# Patient Record
Sex: Female | Born: 1960 | Hispanic: No | Marital: Married | Smoking: Never smoker
Health system: Southern US, Community
[De-identification: ages and names within clinical notes are randomized; demographics above are authoritative.]

## PROBLEM LIST (undated history)

## (undated) DIAGNOSIS — G43909 Migraine, unspecified, not intractable, without status migrainosus: Secondary | ICD-10-CM

## (undated) DIAGNOSIS — IMO0001 Reserved for inherently not codable concepts without codable children: Secondary | ICD-10-CM

## (undated) DIAGNOSIS — D649 Anemia, unspecified: Secondary | ICD-10-CM

## (undated) DIAGNOSIS — R011 Cardiac murmur, unspecified: Secondary | ICD-10-CM

## (undated) DIAGNOSIS — Z5189 Encounter for other specified aftercare: Secondary | ICD-10-CM

## (undated) DIAGNOSIS — E785 Hyperlipidemia, unspecified: Secondary | ICD-10-CM

## (undated) HISTORY — DX: Migraine, unspecified, not intractable, without status migrainosus: G43.909

## (undated) HISTORY — DX: Hyperlipidemia, unspecified: E78.5

## (undated) HISTORY — DX: Anemia, unspecified: D64.9

## (undated) HISTORY — PX: COLONOSCOPY W/ BIOPSIES: SHX1374

## (undated) HISTORY — PX: DILATION AND CURETTAGE OF UTERUS: SHX78

## (undated) HISTORY — PX: KNEE SURGERY: SHX244

## (undated) HISTORY — PX: LASER ABLATION OF THE CERVIX: SHX1949

## (undated) HISTORY — PX: ANKLE SURGERY: SHX546

## (undated) HISTORY — DX: Encounter for other specified aftercare: Z51.89

## (undated) HISTORY — PX: BUNIONECTOMY: SHX129

## (undated) HISTORY — DX: Reserved for inherently not codable concepts without codable children: IMO0001

## (undated) HISTORY — DX: Cardiac murmur, unspecified: R01.1

---
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
## 1999-01-31 HISTORY — PX: OTHER SURGICAL HISTORY: SHX169

## 2003-09-15 ENCOUNTER — Other Ambulatory Visit: Admission: RE | Admit: 2003-09-15 | Discharge: 2003-09-15 | Payer: Self-pay | Admitting: Obstetrics and Gynecology

## 2004-02-29 ENCOUNTER — Other Ambulatory Visit: Admission: RE | Admit: 2004-02-29 | Discharge: 2004-02-29 | Payer: Self-pay | Admitting: Obstetrics and Gynecology

## 2005-03-07 ENCOUNTER — Other Ambulatory Visit: Admission: RE | Admit: 2005-03-07 | Discharge: 2005-03-07 | Payer: Self-pay | Admitting: Obstetrics and Gynecology

## 2005-04-07 ENCOUNTER — Ambulatory Visit (HOSPITAL_COMMUNITY): Admission: RE | Admit: 2005-04-07 | Discharge: 2005-04-07 | Payer: Self-pay | Admitting: Obstetrics and Gynecology

## 2005-04-07 ENCOUNTER — Encounter (INDEPENDENT_AMBULATORY_CARE_PROVIDER_SITE_OTHER): Payer: Self-pay | Admitting: Specialist

## 2007-05-14 ENCOUNTER — Emergency Department (HOSPITAL_COMMUNITY): Admission: EM | Admit: 2007-05-14 | Discharge: 2007-05-14 | Payer: Self-pay | Admitting: Family Medicine

## 2010-06-17 NOTE — Op Note (Signed)
NAMEREBEL, LAUGHRIDGE             ACCOUNT NO.:  [id number]   MEDICAL RECORD NO.:  [id number]          PATIENT TYPE:  AMB   LOCATION:  SDC                           FACILITY:  [facility]   PHYSICIAN:  Duke Salvia. Marcelle Overlie, M.D.DATE OF BIRTH:  [birth year]   DATE OF PROCEDURE:  04/07/2005  DATE OF DISCHARGE:                                 OPERATIVE REPORT   PREOPERATIVE DIAGNOSIS:  Abnormal uterine bleeding, endometrial polyp.   POSTOPERATIVE DIAGNOSIS:  Abnormal uterine bleeding, endometrial polyp.   PROCEDURE:  D&C hysteroscopy, NovaSure EMA.   SURGEON:  Dr. Marcelle Overlie   ANESTHESIA:  Paracervical block plus MAC.   SPECIMENS REMOVED:  Endometrial curettings/endometrial polyp.   PROCEDURE AND FINDINGS:  The patient taken to the operating room.  After an  adequate level of sedation was obtained with the patient's legs in stirrups,  the perineum and vagina were prepped and draped in the usual manner for  vaginal procedures.  The bladder was drained, EUA carried out, uterus mid  position, normal size, adnexa negative.  She had received Cytotec 200 mcg  per vagina the night before surgery.  The cervix was grasped with a  tenaculum, paracervical block created by infiltrating at 3 and 9 o'clock  submucosally, 5-7 mL of 1% Xylocaine on either side after negative  aspiration.  The uterus sounded only to 7 cm.  It was progressively dilated  to a 27 Pratt.  Continued slow diagnostic hysteroscopy was then carried out  revealing a polyp at the fundus.  This was removed in 1 section with a polyp  forcep.  Sharp curettage was carried out, minimal tissue sent to pathology  along with the polyp fragment.  The scope was reinserted.  The cavity was  irrigated and noted to have a clean, smooth appearance.  The cervical length  was 2.5.  The NovaSure was then positioned per protocol, passing the CO2  test with a treatment cycle of 1 minute 20 seconds at a power setting of 62.  She tolerated this well.  There  were no immediate complications.  She went  to recovery room in good condition.  She did receive preop IV antibiotics  and intraoperative Toradol for postop pain relief.      Richard M. Marcelle Overlie, M.D.  Electronically Signed     RMH/MEDQ  D:  04/07/2005  T:  04/08/2005  Job:  [id number]

## 2010-06-17 NOTE — H&P (Signed)
Donna Grant, SULIMAN             ACCOUNT NO.:  [id number]   MEDICAL RECORD NO.:  [id number]          PATIENT TYPE:  AMB   LOCATION:  SDC                           FACILITY:  [facility]   PHYSICIAN:  Duke Salvia. Marcelle Overlie, M.D.DATE OF BIRTH:  10/18/1960   DATE OF ADMISSION:  04/07/2005  DATE OF DISCHARGE:                                HISTORY & PHYSICAL   CHIEF COMPLAINT:  Menorrhagia.   HISTORY OF PRESENT ILLNESS:  50 year old G0, P0.  Patient presents with a  several year history of menorrhagia.  She moved here from [place] where  she had had several D&Cs over the years, all four with benign tissue.  Her  records from [place] stated that she had a history of endometriosis,  although I could not find that she had had a laparoscopy in the past.  Records reviewed from 2003 did show that she was having problems with heavy  bleeding and dysfunctional bleeding at that time, although I could not find  any ultrasound reports included in those records from [place].  She did  have LEEP in 2004.  Paps since that time have been normal.  Recently, she  had failed control with hormones and due to continued problems with bleeding  we had recommended sonohysterography.  Because of poor insurance coverage  for that procedure she preferred diagnostic hysteroscopy, D&C with NovaSure  endometrial ablation.  This procedure including risks of bleeding,  infection, adjacent organ injury, other complications that may require open  or additional surgery all reviewed with her which she understands and  accepts.  The amenorrhea rate, hypomenorrhea rate, and expected outcome post  endometrial ablation reviewed with her which she understands and accepts.  She did review Cytotec 200 mcg per vagina the night before surgery.   PAST MEDICAL HISTORY:   ALLERGIES:  None.   CURRENT MEDICATIONS:  Crestor.   Her partner has had a vasectomy.   FAMILY HISTORY:  Significant for mother with lung and brain cancer.  Otherwise, unremarkable.   PHYSICAL EXAMINATION:  VITAL SIGNS:  Temperature 98.2, blood pressure  120/72.  HEENT:  Unremarkable.  NECK:  Supple without masses.  LUNGS:  Clear.  CARDIOVASCULAR:  Regular rate and rhythm without murmurs, rubs, or gallops  noted.  BREASTS:  Without masses.  ABDOMEN:  Soft, flat, nontender.  PELVIC:  Normal external genitalia,vagina, cervix clear.  Uterus mid  positional size.  Adnexa negative.   IMPRESSION:  Menorrhagia.  History of the same.   PLAN:  D&C, hysteroscopy with NovaSure endometrial ablation.  Procedure and  risks reviewed as above.      Richard M. Marcelle Overlie, M.D.  Electronically Signed     RMH/MEDQ  D:  04/04/2005  T:  04/04/2005  Job:  [id number]

## 2010-09-28 ENCOUNTER — Encounter (INDEPENDENT_AMBULATORY_CARE_PROVIDER_SITE_OTHER): Payer: Self-pay | Admitting: General Surgery

## 2010-10-04 ENCOUNTER — Encounter (INDEPENDENT_AMBULATORY_CARE_PROVIDER_SITE_OTHER): Payer: Self-pay | Admitting: General Surgery

## 2010-10-04 ENCOUNTER — Ambulatory Visit (INDEPENDENT_AMBULATORY_CARE_PROVIDER_SITE_OTHER): Payer: Commercial Indemnity | Admitting: General Surgery

## 2010-10-04 VITALS — BP 102/78 | HR 58 | Temp 98.0°F | Ht 65.5 in | Wt 161.5 lb

## 2010-10-04 DIAGNOSIS — D374 Neoplasm of uncertain behavior of colon: Secondary | ICD-10-CM

## 2010-10-04 DIAGNOSIS — D126 Benign neoplasm of colon, unspecified: Secondary | ICD-10-CM

## 2010-10-04 NOTE — Patient Instructions (Signed)
You have a villous adenoma of the right colon. You will be scheduled for a laparoscopic assisted right colectomy. Our nursing  staff will discuss the preoperative bowel prep and scheduling process.

## 2010-10-04 NOTE — Progress Notes (Signed)
Chief Complaint  Patient presents with  . Other    eval of colon polyps of cecum    HPI Donna Grant is a 49 y.o. female.    This patient is very healthy. She was referred to me by Dr. Carman Ching for surgical management of a large villous adenoma of the cecum. Her primary care physician is Dr. Halford Chessman  in [place]. Her gynecologist is Dr. Richarda Overlie.  The patient does not have any gastrointestinal symptoms. Denies nausea, vomiting, abdominal pain, change in bowel habits or blood in the stool. She denies weight change. She went for a routine exam with Dr. Richarda Overlie and he noted that she had never had a colonoscopy and is now 50 years old and referred her to Dr. Randa Evens.  Screening colonoscopy was performed on September 27, 2010. Dr. Randa Evens got all the way to the cecum and found a polyp that was carpeting the appendiceal orifice and was greater than 50 mm in size. There was a central depression but it was not ulcerated. Biopsies were taken and showed villous adenoma, but no dysplasia.  These are superficial biopsies in the deeper areas of the polyp have not been sampled.  She is otherwise healthy. She underwent endometrial ablation in 2000. She is postmenopausal. She had a left salpingo-oophorectomy through a Pfannenstiel incision in 2001. She's had right knee surgery and ankle fusion. Has hyperlipidemia.  Family history reveals no colon cancer or other cancers. She does not know anything about her father's side of the family. Her brother had a pancreatectomy for benign disease and is now insulin-dependent diabetic. This may have been pancreatitis.  She is here with her husband to discuss surgical management.  HPI  Past Medical History  Diagnosis Date  . Anemia   . Hyperlipidemia   . Migraine headache   . Blood transfusion   . Heart murmur     Past Surgical History  Procedure Date  . Dilation and curettage of uterus   . Laser ablation of the cervix   .  Ankle surgery   . Bunionectomy   . Knee surgery   . Colonoscopy w/ biopsies   . Ovarian cyst 2001  . Mouth surgery 1970    Family History  Problem Relation Age of Onset  . Cancer Mother 28    brain and lung     Social History History  Substance Use Topics  . Smoking status: Never Smoker   . Smokeless tobacco: Not on file  . Alcohol Use: Yes    No Known Allergies  Current Outpatient Prescriptions  Medication Sig Dispense Refill  . fish oil-omega-3 fatty acids 1000 MG capsule Take 1 g by mouth daily.        Marland Kitchen glucosamine-chondroitin 500-400 MG tablet Take 1 tablet by mouth 1 day or 1 dose.        . Multiple Vitamins-Minerals (MULTIVITAMIN WITH MINERALS) tablet Take 1 tablet by mouth daily.        . rosuvastatin (CRESTOR) 20 MG tablet Take 20 mg by mouth daily.        . vitamin D, CHOLECALCIFEROL, 400 UNITS tablet Take 400 Units by mouth daily.          Review of Systems Review of Systems  Constitutional: Negative.   HENT: Negative.   Eyes: Negative.   Respiratory: Negative.   Cardiovascular: Negative.   Gastrointestinal: Negative.   Genitourinary: Negative.   Musculoskeletal: Negative.   Neurological: Negative.   Hematological: Negative.   Psychiatric/Behavioral: Negative.  Blood pressure 102/78, pulse 58, temperature 98 F (36.7 C), height 5' 5.5" (1.664 m), weight 161 lb 8 oz (73.256 kg).  Physical Exam Physical Exam  Constitutional: She is oriented to person, place, and time. She appears well-developed and well-nourished. No distress.  HENT:  Head: Normocephalic and atraumatic.  Mouth/Throat: No oropharyngeal exudate.  Eyes: Conjunctivae and EOM are normal. Left eye exhibits no discharge. No scleral icterus.  Neck: Normal range of motion. Neck supple. No JVD present. No tracheal deviation present. No thyromegaly present.  Cardiovascular: Normal rate, regular rhythm, normal heart sounds and intact distal pulses.   No murmur heard. Pulmonary/Chest:  Breath sounds normal. No respiratory distress. She has no wheezes. She has no rales. She exhibits no tenderness.  Abdominal: Soft. Bowel sounds are normal. She exhibits no distension and no mass. There is no tenderness. There is no rebound and no guarding.    Musculoskeletal: Normal range of motion. She exhibits no edema and no tenderness.  Neurological: She is alert and oriented to person, place, and time. She exhibits normal muscle tone.  Skin: Skin is warm and dry. No rash noted. She is not diaphoretic. No erythema. No pallor.  Psychiatric: She has a normal mood and affect. Her behavior is normal. Judgment and thought content normal.    Data Reviewed I reviewed Dr. Randa Evens colonoscopy report. I reviewed the pathology report and gave the patient a copy of this. I reviewed Dr. Randa Evens office notes. I had a discussion with Dr. Randa Evens.  Assessment    Large villous adenoma of the cecum, located at the appendiceal orifice. There is no dysplasia seen on superficial biopsy. It is possible that there could be cancer deeper within the polyp.  Hyperlipidemia.  Status post open left salpingo-oophorectomy.    Plan    I had a long discussion with the patient and her husband. She is aware of the location of the polyp, and the possibility that this could contain cancer within the deeper layers of the specimen. She is aware that the next after treatment for surgical resection of this area with a laparoscopic-assisted right colectomy.  I have discussed the indications and details of laparoscopic-assisted right colectomy with her and her husband. Risks and complications have been outlined, including but not limited to bleeding, infection, conversion to open laparotomy, injury to adjacent organs such as the intestine or ureter or bladder or vascular structures with major reconstructive surgery, anastomotic leak necessitating reoperation and colostomy, cardiac pulmonary and thromboembolic problems. She is  aware that this may be cancer. She seems to understand all these issues well. At this time all of her questions were answered. She is in full agreement with this plan.  She would like to go ahead with scheduling of this surgery in mid September after her vacation next week.       Williams Dietrick M 10/04/2010, 5:32 PM

## 2010-11-02 ENCOUNTER — Other Ambulatory Visit (INDEPENDENT_AMBULATORY_CARE_PROVIDER_SITE_OTHER): Payer: Self-pay | Admitting: General Surgery

## 2010-11-02 ENCOUNTER — Encounter (HOSPITAL_COMMUNITY): Payer: Commercial Indemnity

## 2010-11-02 ENCOUNTER — Ambulatory Visit (HOSPITAL_COMMUNITY)
Admission: RE | Admit: 2010-11-02 | Discharge: 2010-11-02 | Disposition: A | Discharge status: Home / Self Care | Payer: Commercial Indemnity | Source: Ambulatory Visit | Attending: General Surgery | Admitting: General Surgery

## 2010-11-02 DIAGNOSIS — Z01818 Encounter for other preprocedural examination: Secondary | ICD-10-CM

## 2010-11-02 DIAGNOSIS — Z01812 Encounter for preprocedural laboratory examination: Secondary | ICD-10-CM | POA: Insufficient documentation

## 2010-11-02 LAB — URINALYSIS, ROUTINE W REFLEX MICROSCOPIC
Glucose, UA: NEGATIVE mg/dL
Ketones, ur: NEGATIVE mg/dL
Leukocytes, UA: NEGATIVE
Protein, ur: NEGATIVE mg/dL

## 2010-11-02 LAB — COMPREHENSIVE METABOLIC PANEL
AST: 25 U/L (ref 0–37)
BUN: 10 mg/dL (ref 6–23)
CO2: 30 mEq/L (ref 19–32)
Calcium: 9.4 mg/dL (ref 8.4–10.5)
Chloride: 102 mEq/L (ref 96–112)
Creatinine, Ser: 0.59 mg/dL (ref 0.50–1.10)
GFR calc Af Amer: >90 mL/min (ref >90)
GFR calc non Af Amer: >90 mL/min (ref >90)
Total Bilirubin: 0.4 mg/dL (ref 0.3–1.2)

## 2010-11-02 LAB — CBC
MCH: 30.6 pg (ref 26.0–34.0)
MCHC: 33.7 g/dL (ref 30.0–36.0)
Platelets: 229 10*3/uL (ref 150–400)

## 2010-11-02 LAB — DIFFERENTIAL
Basophils Relative: 1 % (ref 0–1)
Eosinophils Absolute: 0.1 10*3/uL (ref 0.0–0.7)
Monocytes Absolute: 0.3 10*3/uL (ref 0.1–1.0)
Monocytes Relative: 7 % (ref 3–12)

## 2010-11-02 LAB — SURGICAL PCR SCREEN
MRSA, PCR: NEGATIVE
Staphylococcus aureus: NEGATIVE

## 2010-11-02 LAB — ABO/RH: ABO/RH(D): B POS

## 2010-11-02 MED ORDER — METRONIDAZOLE 500 MG PO TABS: 500.0000 mg | ORAL_TABLET | Freq: Three times a day (TID) | ORAL | Status: AC

## 2010-11-02 MED ORDER — CIPROFLOXACIN HCL 500 MG PO TABS: 500.0000 mg | ORAL_TABLET | Freq: Two times a day (BID) | ORAL | Status: AC

## 2010-11-08 ENCOUNTER — Other Ambulatory Visit (INDEPENDENT_AMBULATORY_CARE_PROVIDER_SITE_OTHER): Payer: Self-pay | Admitting: General Surgery

## 2010-11-08 ENCOUNTER — Inpatient Hospital Stay (HOSPITAL_COMMUNITY)
Admission: RE | Admit: 2010-11-08 | Discharge: 2010-11-12 | DRG: 331 | Disposition: A | Discharge status: Home / Self Care | Payer: Commercial Indemnity | Source: Ambulatory Visit | Attending: General Surgery | Admitting: General Surgery

## 2010-11-08 DIAGNOSIS — N805 Endometriosis of intestine, unspecified: Secondary | ICD-10-CM | POA: Diagnosis present

## 2010-11-08 DIAGNOSIS — D126 Benign neoplasm of colon, unspecified: Secondary | ICD-10-CM

## 2010-11-08 DIAGNOSIS — Z01818 Encounter for other preprocedural examination: Secondary | ICD-10-CM

## 2010-11-08 DIAGNOSIS — D378 Neoplasm of uncertain behavior of other specified digestive organs: Principal | ICD-10-CM | POA: Diagnosis present

## 2010-11-08 DIAGNOSIS — E785 Hyperlipidemia, unspecified: Secondary | ICD-10-CM | POA: Diagnosis present

## 2010-11-08 DIAGNOSIS — Z01812 Encounter for preprocedural laboratory examination: Secondary | ICD-10-CM

## 2010-11-08 DIAGNOSIS — D371 Neoplasm of uncertain behavior of stomach: Principal | ICD-10-CM | POA: Diagnosis present

## 2010-11-08 HISTORY — PX: COLON SURGERY: SHX602

## 2010-11-08 LAB — TYPE AND SCREEN: ABO/RH(D): B POS

## 2010-11-08 NOTE — Op Note (Signed)
NAME:  CALAIS, SVEHLA NO.:  [id number]  MEDICAL RECORD NO.:  [id number]  LOCATION:  0004                         FACILITY:  [facility]  PHYSICIAN:  Angelia Mould. Derrell Lolling, M.D.DATE OF BIRTH:  1960-11-03  DATE OF PROCEDURE:  11/08/2010 DATE OF DISCHARGE:                              OPERATIVE REPORT   PREOPERATIVE DIAGNOSIS:  Villous adenoma of cecum.  POSTOPERATIVE DIAGNOSES: 1. Villous adenoma of the cecum. 2. Fibrotic nodules of distal ileum.  OPERATIONS PERFORMED: 1. Laparoscopic-assisted right colectomy. 2. Laparoscopic assisted small bowel resection.  SURGEON:  Dr. Claud Kelp.  FIRST ASSISTANT:  Dr. Ovidio Kin.  OPERATIVE INDICATIONS:  This is a 50 year old Caucasian female who is in good health.  She had her first ever screening colonoscopy recently and Dr. Randa Evens found a large polyp that was carpeting the appendiceal orifice.  He stated that it was greater than 50 mm in size with a central depression.  Surface biopsy showed villous adenoma, no dysplasia.  The only prior surgery she has had is a left salpingo- oophorectomy in 2001 for benign process.  She was advised to undergo resection because of the neoplastic nature of the polyp.  She is aware that there is a possibility that there is early cancer deeper within the specimen.  She has undergone a bowel prep at home and is brought to the operating room electively.  OPERATIVE FINDINGS:  The patient had basically normal anatomy except there was 1 or 2 small adhesions in the pelvis from her previous surgery.  The appendix looked normal.  The colon looked normal.  The liver and gallbladder looked normal.  Once we removed the right colon specimen, we could feel about a 3 cm soft fleshy mass in the cecum, but there was no evidence of malignancy.  Also noted was a couple of firm whitish areas in the distal ileum proximal to the initial line of resection.  It was not clear whether this was just simply  fibrosis or burned out endometriosis, we therefore resected this.  There was no other evidence of endometriosis, however.  OPERATIVE TECHNIQUE:  Following the induction of general endotracheal anesthesia, a Foley catheter was placed.  Intravenous antibiotics were given.  The abdomen was prepped and draped in sterile fashion.  Surgical time-out was held identifying correct patient and correct procedure.  At the end of the case, we injected 30 cc Exparel, which was a 50% solution.  This was injected into all of the incisions.  Short vertical incision was made in the midline just above the umbilicus.  The fascia was incised in the midline and the abdominal cavity entered under direct vision.  An 11 mm Hasson trocar was inserted and secured with pursestring suture of 0 Vicryl.  Pneumoperitoneum was created.  Video visualization was carried out with findings as described above.  A 5 mm trocar was placed in the lower midline and a 5 mm trocar placed in the left mid abdomen.  After examining the abdomen in general, we clarified the anatomy of the appendix and the terminal ileum.  We divided the lateral peritoneal attachments of the cecum and terminal ileum and mobilized them up toward the left upper quadrant.  We continued the dissection cephalad dividing the lateral peritoneal attachments of the right colon all the way up to the hepatic flexure.  We slowly mobilized the colon and cecum medially. We then repositioned the patient and then took down the hepatic flexure. We use all this with the EnSeal energy device.  We identified the gastric antrum, the pylorus, and the C-loop of the duodenum.  The duodenum was well visualized throughout the case and preserved.  Once we had complete mobilization, we felt that we could proceed to exteriorize the specimen.  The pneumoperitoneum was released.  The [facility] catheter was removed. The midline incision was made approximately 7 cm in length starting  just above the umbilicus and going up the midline.  The fascia was incised in the midline.  A wound protector was placed.  The right colon and terminal ileum were easily delivered into the wound.  There was no bleeding.  We transected the terminal ileum about 2 inches proximal to the ileocecal valve using the GIA stapling device.  We transected the right transverse colon just to the right of the middle colic vessels using a GIA stapling device.  We then skeletonized all of the right colic and ileocolic vessels.  Larger vessels were clamped, divided, and ligated with 2-0 silk ties.  Smaller vessels were divided with the EnSeal.  The specimen was sent to the lab for routine histology with the attached history.  We then inspected the bowel and found that there were a couple of a whitish firm areas in the distal ileum about 3 inches proximal to the transection line.  We examined the rest of the small bowel and found no other areas like this.  We were unsure of the diagnosis.  We took the GIA and then resected about 6 more inches of distal ileum.  We divided the mesentery with the EnSeal device.  We sent that specimen to the lab. Dr. Dierdre Searles examined these with frozen section and said it was benign fibrosis.  She said it would be possible that this might be burned out endometriosis, but there was no evidence of malignancy.  We felt that no further surgery would be indicated.  We created an anastomosis between the distal ileum and the right transverse colon using a GIA stapling device.  The defect in the bowel wall was closed with a TA-60 stapling device.  Further sutures of 3-0 silk were placed to reinforce the staple line at critical points.  The anastomosis was looked at and inspected and looked good.  We then changed our instruments and gloves.  The mesentery was closed with multiple interrupted sutures of 3-0 silk.  The abdomen was irrigated with saline and the irrigation fluid removed as  best as possible.  The midline fascia was closed with a running suture of #1 double-stranded PDS.  The skin was irrigated with saline.  We re-insufflated the abdomen and put a 5 mm camera in the lower ports.  We looked around.  There was a little bit of irrigation fluid in the pelvis, right paracolic gutter, and subphrenic spaces; we removed that.  We could see the anastomosis.  It did not appear to be any blood.  We then released the pneumoperitoneum. We removed the trocars.  We irrigated the skin incisions with saline. The skin incisions were then closed with subcuticular sutures of Monocryl.  Clean bandages were placed and the patient taken to recovery room in stable condition.  Estimated blood loss was about 30 to 50 cc.  Complications, none.  Sponge, needle, and instrument counts were correct.     Angelia Mould. Derrell Lolling, M.D.     HMI/MEDQ  D:  11/08/2010  T:  11/08/2010  Job:  [id number]  cc:   Fayrene Fearing L. Malon Kindle., M.D. Fax: [phone]  Dr. Rosina Lowenstein. Marcelle Overlie, M.D. Fax: [phone]  Electronically Signed by Claud Kelp M.D. on 11/08/2010 02:34:41 PM

## 2010-11-15 ENCOUNTER — Encounter (INDEPENDENT_AMBULATORY_CARE_PROVIDER_SITE_OTHER): Payer: Self-pay | Admitting: General Surgery

## 2010-11-16 ENCOUNTER — Telehealth (INDEPENDENT_AMBULATORY_CARE_PROVIDER_SITE_OTHER): Payer: Self-pay | Admitting: General Surgery

## 2010-11-16 NOTE — Discharge Summary (Signed)
  NAME:  [name], Donna Grant NO.:  [id number]  MEDICAL RECORD NO.:  [id number]  LOCATION:  1540                         FACILITY:  [facility]  PHYSICIAN:  Angelia Mould. Derrell Lolling, M.D.DATE OF BIRTH:  October 15, 1960  DATE OF ADMISSION:  11/08/2010 DATE OF DISCHARGE:  11/12/2010                              DISCHARGE SUMMARY   FINAL DIAGNOSES: 1. Villous adenoma of the cecum. 2. Endometriosis of the terminal ileum. 3. Hyperlipidemia. 4. history of left salpingo-oophorectomy.  OPERATION PERFORMED: 1. Laparoscopic-assisted right colectomy. 2. Laparoscopic-assisted small bowel resection, date November 08, 2010.  HISTORY:  This is a 50 year old Caucasian female who had a screening colonoscopy recently and a large polyp was found carpeting the appendiceal orifice.  This was described as being 50 mm in size. Surface biopsy showed villous adenoma and no dysplasia.  Her only prior surgery was a left salpingo-oophorectomy and she stated that she had a cyst and possibly was told she had some endometriosis.  She was advised to undergo resection of the colon lesion and agreed to that and underwent a bowel prep at home and was brought to the hospital electively.  HOSPITAL COURSE:  On the day of admission, the patient underwent laparoscopic-assisted right colectomy.  We found a small fleshy polyp in the cecum and also found some whitish firm nodules on the distal ileum, which necessitated a second separate resection.  We were able to simply include all of this in the resection field and perform a single anastomosis.  Final pathology report showed a tubulovillous adenoma, 3.2 cm in dimension of the cecum.  There was no invasive cancer.  25 lymph nodes were evaluated and they are benign.  The small intestine resection showed endometriosis with associated adhesions.  There was no evidence of malignancy.  The patient was advised of her pathology report.  Postoperatively, she did well.  We  removed the Foley catheter and started a clear liquid diet on postop day 1.  She was maintained on Entereg protocol.  She became more active.  We were able to cut her narcotic back and advanced her diet over the next couple of days.  She did well.  She was ready for discharge on October 13th.  At that time, she was feeling better and tolerating her diet better.  She had loose bowel movements and wanted to go home.  Her wounds looked good.  She was given a prescription for Vicodin for pain.  She was told to continue her usual medicines, which are, 1. Crestor 20 mg a day. 2. Fish oil 500 mg twice a day. 3. Multivitamins once a day. 4. Vitamin D 1 tab daily. She was asked to return to see me in the office in 3 weeks.  Diet and activities were discussed.     Angelia Mould. Derrell Lolling, M.D.     HMI/MEDQ  D:  11/15/2010  T:  11/16/2010  Job:  [id number]  cc:   Fayrene Fearing L. Malon Kindle., M.D. Fax: [phone]  Duke Salvia. Marcelle Overlie, M.D. Fax: [phone]  Dr. Halford Chessman  Electronically Signed by Claud Kelp M.D. on 11/16/2010 01:43:20 PM

## 2010-11-16 NOTE — Telephone Encounter (Signed)
Patient has a po appt on 12/06/10, but is returning to work on 12/01/10.  She needs a return to work letter faxed to [phone].

## 2010-11-21 ENCOUNTER — Telehealth (INDEPENDENT_AMBULATORY_CARE_PROVIDER_SITE_OTHER): Payer: Self-pay

## 2010-11-21 NOTE — Telephone Encounter (Signed)
RTW note mailed to pts home address per pt request.

## 2010-12-06 ENCOUNTER — Ambulatory Visit (INDEPENDENT_AMBULATORY_CARE_PROVIDER_SITE_OTHER): Payer: Commercial Indemnity | Admitting: General Surgery

## 2010-12-06 ENCOUNTER — Encounter (INDEPENDENT_AMBULATORY_CARE_PROVIDER_SITE_OTHER): Payer: Self-pay | Admitting: General Surgery

## 2010-12-06 VITALS — BP 116/80 | HR 68 | Temp 97.4°F | Resp 14 | Ht 65.0 in | Wt 157.2 lb

## 2010-12-06 DIAGNOSIS — Z9889 Other specified postprocedural states: Secondary | ICD-10-CM

## 2010-12-06 NOTE — Progress Notes (Signed)
Subjective:     Patient ID: Donna Grant, female   DOB: 06-07-1960, 50 y.o.   MRN: [id number]  HPI She returns for a postop check. She is doing well.  The patient underwent a laparoscopic-assisted right colectomy and laparoscopic-assisted small bowel resection with a single anastomosis. Date of surgery was November 08, 2010. Final pathology showed a benign villous adenoma of the cecum and endometriosis implants on the terminal ileum. She has been informed of her diagnosis and given a copy of her pathology report.  She is basically feeling well. Her appetite is normal. Her bowel movements have returned to normal. She's had no fever chills or drainage from her incision. She has minimal tenderness in her incision.  Review of Systems     Objective:   Physical Exam The patient looks well. She is fit. She is in no distress  Abdomen soft and nontender. Midline incision and trocar sites are well healed. No signs of infection or hernia.   Assessment:     Villous adenoma of cecum.  Endometriosis implants of terminal ileum.  Uneventful recovery following laparoscopic-assisted right colectomy.    Plan:     Okay to resume all normal physical activities without restriction in 7-10 days.  She is advised to get a colonoscopy in one year and periodically thereafter.  Return to see me p.r.n.

## 2010-12-06 NOTE — Patient Instructions (Signed)
The large polyp in your colon was a benign villous adenoma. There was no evidence of malignancy. You also had some old endometriosis spots on the small intestine. All of this has been removed. Your are doing well from the surgery. You  may return to the gym for light exercise next week and full exercise the following week. I advised you to get a colonoscopy in one year and periodically thereafter. Return to see me if there are any further problems.

## 2011-10-11 ENCOUNTER — Encounter (INDEPENDENT_AMBULATORY_CARE_PROVIDER_SITE_OTHER): Payer: Self-pay

## 2014-02-10 ENCOUNTER — Ambulatory Visit
Admission: RE | Admit: 2014-02-10 | Discharge: 2014-02-10 | Disposition: A | Discharge status: Home / Self Care | Payer: Managed Care, Other (non HMO) | Source: Ambulatory Visit | Attending: Family Medicine | Admitting: Family Medicine

## 2014-02-10 ENCOUNTER — Other Ambulatory Visit: Payer: Self-pay | Admitting: Lactation Services

## 2014-02-10 DIAGNOSIS — R059 Cough, unspecified: Secondary | ICD-10-CM

## 2014-02-10 DIAGNOSIS — R05 Cough: Secondary | ICD-10-CM

## 2014-04-30 ENCOUNTER — Ambulatory Visit
Admission: RE | Admit: 2014-04-30 | Discharge: 2014-04-30 | Disposition: A | Discharge status: Home / Self Care | Payer: Managed Care, Other (non HMO) | Source: Ambulatory Visit | Attending: Family Medicine | Admitting: Family Medicine

## 2014-04-30 ENCOUNTER — Other Ambulatory Visit: Payer: Self-pay | Admitting: Family Medicine

## 2014-04-30 DIAGNOSIS — J189 Pneumonia, unspecified organism: Secondary | ICD-10-CM

## 2014-06-09 ENCOUNTER — Other Ambulatory Visit: Payer: Self-pay | Admitting: Podiatry

## 2014-10-15 ENCOUNTER — Other Ambulatory Visit: Payer: Self-pay | Admitting: General Surgery

## 2017-03-30 ENCOUNTER — Telehealth: Payer: Self-pay | Admitting: Podiatry

## 2017-03-30 NOTE — Telephone Encounter (Signed)
Pt called and said she seen [place] several years ago with a workman's comp case and they sent [place] a request for [place] to see her again for new orthotics.  She emailed me some information that the Building control surveyor stated she has sent to [place]. But it is missing some information.  I left message for claims manager to call me back.  I notified pt that we are needing additional information and I have lvm for claims manager and waiting on a call back.When I get all the information I will call pt to schedule an appt.

## 2017-12-05 ENCOUNTER — Other Ambulatory Visit: Payer: Self-pay | Admitting: Obstetrics and Gynecology

## 2017-12-05 DIAGNOSIS — Z803 Family history of malignant neoplasm of breast: Secondary | ICD-10-CM

## 2018-01-03 ENCOUNTER — Encounter: Payer: Self-pay | Admitting: Physician Assistant

## 2018-01-03 ENCOUNTER — Ambulatory Visit: Payer: Commercial Managed Care - PPO | Admitting: Physician Assistant

## 2018-01-03 VITALS — BP 138/68 | HR 65 | Ht 65.5 in | Wt 184.4 lb

## 2018-01-03 DIAGNOSIS — E7801 Familial hypercholesterolemia: Secondary | ICD-10-CM | POA: Diagnosis not present

## 2018-01-03 DIAGNOSIS — R079 Chest pain, unspecified: Secondary | ICD-10-CM

## 2018-01-03 MED ORDER — METOPROLOL TARTRATE 50 MG PO TABS: 50.0000 mg | ORAL_TABLET | Freq: Once | ORAL | 0 refills | Status: DC

## 2018-01-03 NOTE — Progress Notes (Signed)
Cardiology Office Note   Date:  01/03/2018   ID:  Donna Grant, DOB 12-Aug-1960, MRN [id number]  PCP:  Jonathon Jordan, MD Cardiologist:   New, will be Dr Oliver Barre, PA-C   No chief complaint on file.   History of Present Illness: Donna Grant is a 57 y.o. female with a history of familial hyperlipidemia, migraines, anemia, colon polyps  Her husband was seen in the office 2 days ago and she mentioned episodes of chest pain, appointment made  Donna Grant presents for cardiology evaluation.   She has had hyperlipidemia, dx age 45. However, she tried dietary management until age 57. She has been on Crestor since then. She is not always compliant with a low-cholesterol, but feels she generally does very well.   At one point, her cholesterol total was 344, even when she was very strict with the diet.   She has been getting chest pain for about a year. It is a dull pain, mid-left chest.   She rides a mountain bike, does strength training 2 x week. She does not have exertional symptoms.   The pain will start at rest, 2/10 at its worst, a dull pain. No SOB, N&V or diaphoresis. No change w/ deep inspiration, not positional. It will last just a few seconds. She gets 2-3 episodes a day. No rx tried.   The other day, when she was having the chest pain, she had sharp pain radiate to her L arm. That was unusual. It concerned her and led to her mentioning the pain and desiring an evaluation.   No factors that have made the pain worse or better.   She has been going through menopause for over a decade, is on the tail end of it now.    Past Medical History:  Diagnosis Date  . Anemia   . Blood transfusion   . Heart murmur   . Hyperlipidemia   . Migraine headache     Past Surgical History:  Procedure Laterality Date  . ANKLE SURGERY    . BUNIONECTOMY    . COLON SURGERY  11/08/10   Lap right colectomy  . COLONOSCOPY W/ BIOPSIES    . DILATION AND CURETTAGE OF UTERUS     . KNEE SURGERY    . LASER ABLATION OF THE CERVIX    . MOUTH SURGERY  1970  . ovarian cyst  2001    Current Outpatient Medications  Medication Sig Dispense Refill  . Calcium-Phosphorus-Vitamin D (CITRACAL +D3 PO) Take by mouth. 600 mg-500 IU    . fish oil-omega-3 fatty acids 1000 MG capsule Take 1 g by mouth daily.      . Multiple Vitamins-Minerals (MULTIVITAMIN WITH MINERALS) tablet Take 1 tablet by mouth daily.      . rosuvastatin (CRESTOR) 20 MG tablet Take 20 mg by mouth daily.      . metoprolol tartrate (LOPRESSOR) 50 MG tablet Take 1 tablet (50 mg total) by mouth once for 1 dose. 1 tablet 0   No current facility-administered medications for this visit.     Allergies:   Patient has no known allergies.    Social History:  The patient  reports that she has never smoked. She has never used smokeless tobacco. She reports that she drinks alcohol. She reports that she does not use drugs.   Family History:  The patient's family history includes Cancer (age of onset: 50) in her mother.  She indicated that her mother is deceased. She indicated that her  father is alive.    ROS:  Please see the history of present illness. All other systems are reviewed and negative.    PHYSICAL EXAM: VS:  BP 138/68   Pulse 65   Ht 5' 5.5" (1.664 m)   Wt 184 lb 6.4 oz (83.6 kg)   BMI 30.22 kg/m  , BMI Body mass index is 30.22 kg/m. GEN: Well nourished, well developed, female in no acute distress HEENT: normal for age  Neck: no JVD, no carotid bruit, no masses Cardiac: RRR; soft murmur, no rubs, or gallops Respiratory:  clear to auscultation bilaterally, normal work of breathing GI: soft, nontender, nondistended, + BS MS: no deformity or atrophy; no edema; distal pulses are 2+ in all 4 extremities  Skin: warm and dry, no rash Neuro:  Strength and sensation are intact Psych: euthymic mood, full affect   EKG:  EKG is ordered today. The ekg ordered today demonstrates sinus rhythm, heart  rate 65, normal intervals, no acute ischemic changes and no Q waves  No previous cardiac testing   Recent Labs: No results found for requested labs within last 8760 hours.  CBC    Component Value Date/Time   WBC 4.1 11/02/2010 0930   RBC 4.44 11/02/2010 0930   HGB 13.6 11/02/2010 0930   HCT 40.4 11/02/2010 0930   PLT 229 11/02/2010 0930   MCV 91.0 11/02/2010 0930   MCH 30.6 11/02/2010 0930   MCHC 33.7 11/02/2010 0930   RDW 12.9 11/02/2010 0930   LYMPHSABS 1.1 11/02/2010 0930   MONOABS 0.3 11/02/2010 0930   EOSABS 0.1 11/02/2010 0930   BASOSABS 0.0 11/02/2010 0930   CMP Latest Ref Rng & Units 11/02/2010  Glucose 70 - 99 mg/dL 84  BUN 6 - 23 mg/dL 10  Creatinine 0.50 - 1.10 mg/dL 0.59  Sodium 135 - 145 mEq/L 137  Potassium 3.5 - 5.1 mEq/L 4.3  Chloride 96 - 112 mEq/L 102  CO2 19 - 32 mEq/L 30  Calcium 8.4 - 10.5 mg/dL 9.4  Total Protein 6.0 - 8.3 g/dL 7.4  Total Bilirubin 0.3 - 1.2 mg/dL 0.4  Alkaline Phos 39 - 117 U/L 29(L)  AST 0 - 37 U/L 25  ALT 0 - 35 U/L 17     Lipid Panel Per [facility] review: 04/02/2017 Total cholesterol 204 HDL 60 LDL 133 Triglycerides 39    Wt Readings from Last 3 Encounters:  01/03/18 184 lb 6.4 oz (83.6 kg)  12/06/10 157 lb 3.2 oz (71.3 kg)  10/04/10 161 lb 8 oz (73.3 kg)     Other studies Reviewed: Additional studies/ records that were reviewed today include: Office notes, hospital records and testing.  ASSESSMENT AND PLAN: The patient and plan were reviewed with Dr. Oval Linsey  1.  Chest pain, moderate risk for cardiac etiology: -It will help to have an assessment of any plaque in her coronary arteries, regardless of whether it is obstructive -She is fit and exercises regularly without symptoms, do not believe any kind of treadmill testing will be helpful. - Therefore, the best test is a cardiac CT.  If she has nonobstructive coronary artery disease or a high calcium score, her goal LDL will decrease to 70. -If she has any plaque at  all, she should also be started on a baby aspirin, and a beta-blocker in addition to her statin  2.  Familial hyperlipidemia: Currently, goal LDL is less than 130 and she is there. - However, if she has any plaque at all in her arteries  or an elevated calcium score, goal LDL decreases to less than 70. -At that time, she would need to take a higher dose of Crestor   Current medicines are reviewed at length with the patient today.  The patient does not have concerns regarding medicines.  The following changes have been made:  no change  Labs/ tests ordered today include:   Orders Placed This Encounter  Procedures  . CT CORONARY MORPH W/CTA COR W/SCORE W/CA W/CM &/OR WO/CM  . CT CORONARY FRACTIONAL FLOW RESERVE DATA PREP  . CT CORONARY FRACTIONAL FLOW RESERVE FLUID ANALYSIS  . Basic metabolic panel  . EKG 12-Lead     Disposition:   FU with Skeet Latch, MD  Signed, Rosaria Ferries, PA-C  01/03/2018 4:06 PM    [place] Phone: [phone]; Fax: [phone]

## 2018-01-03 NOTE — Patient Instructions (Addendum)
Medication Instructions:  The current medical regimen is effective;  continue present plan and medications.  If you need a refill on your cardiac medications before your next appointment, please call your pharmacy.   Lab work: BMET If you have labs (blood work) drawn today and your tests are completely normal, you will receive your results only by: Marland Kitchen MyChart Message (if you have MyChart) OR . A paper copy in the mail If you have any lab test that is abnormal or we need to change your treatment, we will call you to review the results.  Testing/Procedures: Your physician has requested that you have cardiac CT (NEXT WEEK). Cardiac computed tomography (CT) is a painless test that uses an x-ray machine to take clear, detailed pictures of your heart. For further information please visit [email or web address][email or web address]. Please follow instruction sheet as given.   Follow-Up: At [facility], you and your health needs are our priority.  As part of our continuing mission to provide you with exceptional heart care, we have created designated Provider Care Teams.  These Care Teams include your primary Cardiologist (physician) and Advanced Practice Providers (APPs -  Physician Assistants and Nurse Practitioners) who all work together to provide you with the care you need, when you need it. You will need a follow up appointment after Cardiac CT with Dr.Chatham or one of the following Advanced Practice Providers on your designated Care Team:   . Rosaria Ferries, PA-C  Any Other Special Instructions Will Be Listed Below (If Applicable).    Please arrive at the [facility] main entrance of [facility] at xx:xx AM (30-45 minutes prior to test start time)  [facility] [place],  [id number] [phone]  Proceed to the [facility] Radiology Department (First Floor).  Please follow these instructions carefully (unless otherwise directed):  Hold all erectile dysfunction  medications at least 48 hours prior to test.  On the Night Before the Test: . Be sure to Drink plenty of water. . Do not consume any caffeinated/decaffeinated beverages or chocolate 12 hours prior to your test. . Do not take any antihistamines 12 hours prior to your test.  On the Day of the Test: . Drink plenty of water. Do not drink any water within one hour of the test. . Do not eat any food 4 hours prior to the test. . You may take your regular medications prior to the test.  . Take metoprolol (Lopressor) one hour prior to test. . HOLD Furosemide/Hydrochlorothiazide morning of the test.      After the Test: . Drink plenty of water. . After receiving IV contrast, you may experience a mild flushed feeling. This is normal. . On occasion, you may experience a mild rash up to 24 hours after the test. This is not dangerous. If this occurs, you can take Benadryl 25 mg and increase your fluid intake. . If you experience trouble breathing, this can be serious. If it is severe call 911 IMMEDIATELY. If it is mild, please call our office. . If you take any of these medications: Glipizide/Metformin, Avandament, Glucavance, please do not take 48 hours after completing test.

## 2018-02-04 ENCOUNTER — Telehealth: Payer: Self-pay | Admitting: Physician Assistant

## 2018-02-04 NOTE — Telephone Encounter (Signed)
We felt it would help to have an assessment of the amount of plaque in her arteries, even if it is nonobstructive.  That would change her goal LDL.  Please start the appeals process.

## 2018-02-04 NOTE — Telephone Encounter (Signed)
Message routed to [facility] to see if appeals process has been started   Coronary CT with FFR was ordered 12/5

## 2018-02-04 NOTE — Telephone Encounter (Signed)
New message   Patient states that she received a letter from [employer] stating that did not meet the criteria for the CT test. Please call to discuss.

## 2018-02-10 ENCOUNTER — Other Ambulatory Visit: Payer: Commercial Managed Care - PPO

## 2018-02-12 NOTE — Telephone Encounter (Signed)
Spoke with patient and informed her that Suanne Marker wanted to start the appeal process.  She gave me the phone number [phone]  Ext [phone] and the reference number is [id number]. She stated the denial letter stated that they did not receive enough clinical criteria.   She also wanted me to inform Suanne Marker that her pain level is now a 6 and the chest pain is happening more frequently. She stated if the pain became unbearable then she would go to the ED.  Do we need to give her a rx for Nitro?   I will contact the insurance tomorrow to start the process. She voiced understanding.

## 2018-02-14 NOTE — Telephone Encounter (Signed)
Please go ahead and give her nitro. Explain that we can continue the appeals process, or just schedule a Lexiscan, could probably get that next week. See what her preference is.  Thanks

## 2018-02-20 MED ORDER — NITROGLYCERIN 0.4 MG SL SUBL: 0.4000 mg | SUBLINGUAL_TABLET | SUBLINGUAL | 3 refills | Status: AC | PRN

## 2018-02-20 NOTE — Telephone Encounter (Signed)
Spoke with Donna Grant in pre-cert and she stated the appeal process on 02/04/2018. I told her that if Donna Grant needed to she would write a letter. Donna Grant stated we are currently waiting for a response.   Spoke with patient, informed her of the status of her appeal. Informed her of [name]'s recommendation for Lexiscan, patient declined test. Patient stated she would wait to see what the decision is from her appeal. Informed patient that the appeal process had been started early this month and we were waiting for an response.  Informed patient that Donna Grant was sending her a rx in from [facility] for emergency chest pain.   Advise patient that I would be in contact with her once a decision has been made. She voiced understanding.

## 2018-04-25 ENCOUNTER — Other Ambulatory Visit: Payer: Self-pay | Admitting: Obstetrics and Gynecology

## 2018-04-25 DIAGNOSIS — N644 Mastodynia: Secondary | ICD-10-CM

## 2018-05-01 ENCOUNTER — Other Ambulatory Visit: Payer: Self-pay

## 2018-05-01 ENCOUNTER — Ambulatory Visit: Payer: Commercial Managed Care - PPO

## 2018-05-01 ENCOUNTER — Ambulatory Visit
Admission: RE | Admit: 2018-05-01 | Discharge: 2018-05-01 | Disposition: A | Discharge status: Home / Self Care | Payer: Commercial Managed Care - PPO | Source: Ambulatory Visit | Attending: Obstetrics and Gynecology | Admitting: Obstetrics and Gynecology

## 2018-05-01 DIAGNOSIS — N644 Mastodynia: Secondary | ICD-10-CM

## 2018-07-18 ENCOUNTER — Telehealth: Payer: Self-pay | Admitting: Physician Assistant

## 2018-07-18 NOTE — Telephone Encounter (Signed)
Called the patient to discuss options for stress testing, since her cardiac CT was denied.  I explained the pros and cons of the calcium score, and the Myoview.  I explained that a heart catheterization was not really indicated unless she had high risk stress testing.  After discussion, she prefers to go ahead and get the calcium score.  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  She needs to get the calcium score soon as possible so she can have results and know where she stands from a cardiac standpoint before she has to care for him.  I will route this to staff members and follow-up tomorrow.  Hopefully this can be scheduled for next week.  Once it is scheduled, I will see which physician is available to read that day and make sure it gets read.  Rosaria Ferries, PA-C 07/18/2018 5:13 PM [facility] [phone]

## 2018-07-23 ENCOUNTER — Other Ambulatory Visit: Payer: Self-pay | Admitting: Physician Assistant

## 2018-07-23 DIAGNOSIS — R079 Chest pain, unspecified: Secondary | ICD-10-CM

## 2018-08-01 NOTE — Telephone Encounter (Signed)
Cardiac CT scheduled for 08/21/18.

## 2018-08-20 ENCOUNTER — Telehealth: Payer: Self-pay | Admitting: *Deleted

## 2018-08-20 NOTE — Telephone Encounter (Signed)

## 2018-08-21 ENCOUNTER — Ambulatory Visit (INDEPENDENT_AMBULATORY_CARE_PROVIDER_SITE_OTHER)
Admission: RE | Admit: 2018-08-21 | Discharge: 2018-08-21 | Disposition: A | Discharge status: Home / Self Care | Payer: Self-pay | Source: Ambulatory Visit | Attending: Physician Assistant | Admitting: Physician Assistant

## 2018-08-21 ENCOUNTER — Other Ambulatory Visit: Payer: Self-pay

## 2018-08-21 DIAGNOSIS — R079 Chest pain, unspecified: Secondary | ICD-10-CM

## 2018-08-29 ENCOUNTER — Other Ambulatory Visit: Payer: Commercial Managed Care - PPO

## 2018-11-21 ENCOUNTER — Encounter: Payer: Self-pay | Admitting: Neurology

## 2018-12-18 NOTE — Progress Notes (Signed)
NEUROLOGY CONSULTATION NOTE  Donna Grant MRN: [id number] DOB: 1961/01/18  Referring provider: Jonathon Jordan, MD Primary care provider: Jonathon Jordan, MD  Reason for consult:  Smelling smoke  HISTORY OF PRESENT ILLNESS: Donna Grant is a 58 year old female with migraines who presents for smelling smoke.  History supplemented by referring provider note.  In September, she started smelling cigarette smoke all of the time, only in the house.  It was like she was in a smoky bar.  Her throat would get sore.  Intensity fluctuate.  She was on prednisone at the time after injuring her back and it started after finishing the taper.  No preceding head trauma or illness.  At the time, she had headaches (a pressure in the back of head to temples) which have since resolved 3 1/2 weeks ago.  They smell is present in all of the rooms.  It appears more prominent when she is sitting.  At first, she only noticed it in the evenings but gradually became all of the time.  The only time she cannot smell it at home is when she is taking a shower.  Her husband doesn't smell anything.  She tried looking for a source but couldn't find one.  She doesn't smell it outside the house, whether it is outside, in her car or at the grocery store.  She has been quarantined at home since the start of the pandemic and has not been to anybody else's home.  She does not smell it here in the office.  At first, she had associated hyperacuity of all smell, where everything smelled strong, but this has since resolved.  She denies visual disturbance, altered taste or paresthesias.  In the early 1980s, she was treated for migraines but they subsequently stopped.  No history of seizures or head trauma.    04/01/2018 LABS:  CBC with WBC 4.9, HGB 14, HCT 42.3, PLT 222; CMP with Na 141, K 3.9, Cl 103, CO2 30, glucose 87, BUN 12, Cr 0.75, t bili 0.7, ALP 22, ALP 30, AST 25  PAST MEDICAL HISTORY: Past Medical History:  Diagnosis Date  .  Anemia   . Blood transfusion   . Heart murmur   . Hyperlipidemia   . Migraine headache     PAST SURGICAL HISTORY: Past Surgical History:  Procedure Laterality Date  . ANKLE SURGERY    . BUNIONECTOMY    . COLON SURGERY  11/08/10   Lap right colectomy  . COLONOSCOPY W/ BIOPSIES    . DILATION AND CURETTAGE OF UTERUS    . KNEE SURGERY    . LASER ABLATION OF THE CERVIX    . MOUTH SURGERY  1970  . ovarian cyst  2001    MEDICATIONS: Current Outpatient Medications on File Prior to Visit  Medication Sig Dispense Refill  . Calcium-Phosphorus-Vitamin D (CITRACAL +D3 PO) Take by mouth. 600 mg-500 IU    . fish oil-omega-3 fatty acids 1000 MG capsule Take 1 g by mouth daily.      . metoprolol tartrate (LOPRESSOR) 50 MG tablet Take 1 tablet (50 mg total) by mouth once for 1 dose. 1 tablet 0  . Multiple Vitamins-Minerals (MULTIVITAMIN WITH MINERALS) tablet Take 1 tablet by mouth daily.      . nitroGLYCERIN (NITROSTAT) 0.4 MG SL tablet Place 1 tablet (0.4 mg total) under the tongue every 5 (five) minutes as needed for chest pain. 25 tablet 3  . rosuvastatin (CRESTOR) 20 MG tablet Take 20 mg by mouth daily.  No current facility-administered medications on file prior to visit.     ALLERGIES: No Known Allergies  FAMILY HISTORY: Family History  Problem Relation Age of Onset  . Cancer Mother 11       brain and lung    SOCIAL HISTORY: Social History   Socioeconomic History  . Marital status: Married    Spouse name: Not on file  . Number of children: Not on file  . Years of education: Not on file  . Highest education level: Not on file  Occupational History  . Not on file  Social Needs  . Financial resource strain: Not on file  . Food insecurity    Worry: Not on file    Inability: Not on file  . Transportation needs    Medical: Not on file    Non-medical: Not on file  Tobacco Use  . Smoking status: Never Smoker  . Smokeless tobacco: Never Used  Substance and Sexual  Activity  . Alcohol use: Yes  . Drug use: No  . Sexual activity: Not on file  Lifestyle  . Physical activity    Days per week: Not on file    Minutes per session: Not on file  . Stress: Not on file  Relationships  . Social Herbalist on phone: Not on file    Gets together: Not on file    Attends religious service: Not on file    Active member of club or organization: Not on file    Attends meetings of clubs or organizations: Not on file    Relationship status: Not on file  . Intimate partner violence    Fear of current or ex partner: Not on file    Emotionally abused: Not on file    Physically abused: Not on file    Forced sexual activity: Not on file  Other Topics Concern  . Not on file  Social History Narrative  . Not on file    REVIEW OF SYSTEMS: Constitutional: No fevers, chills, or sweats, no generalized fatigue, change in appetite Eyes: No visual changes, double vision, eye pain Ear, nose and throat: No hearing loss, ear pain, nasal congestion, sore throat Cardiovascular: No chest pain, palpitations Respiratory:  No shortness of breath at rest or with exertion, wheezes GastrointestinaI: No nausea, vomiting, diarrhea, abdominal pain, fecal incontinence Genitourinary:  No dysuria, urinary retention or frequency Musculoskeletal:  No neck pain, back pain Integumentary: No rash, pruritus, skin lesions Neurological: as above Psychiatric: No depression, insomnia, anxiety Endocrine: No palpitations, fatigue, diaphoresis, mood swings, change in appetite, change in weight, increased thirst Hematologic/Lymphatic:  No purpura, petechiae. Allergic/Immunologic: no itchy/runny eyes, nasal congestion, recent allergic reactions, rashes  PHYSICAL EXAM: Blood pressure 137/77, pulse (!) 56, height 5\' 5"  (1.651 m), weight 185 lb 9.6 oz (84.2 kg), SpO2 98 %. General: No acute distress.  Patient appears well-groomed.   Head:  Normocephalic/atraumatic Eyes:  fundi examined but  not visualized Neck: supple, no paraspinal tenderness, full range of motion Back: No paraspinal tenderness Heart: regular rate and rhythm Lungs: Clear to auscultation bilaterally. Vascular: No carotid bruits. Neurological Exam: Mental status: alert and oriented to person, place, and time, recent and remote memory intact, fund of knowledge intact, attention and concentration intact, speech fluent and not dysarthric, language intact. Cranial nerves: CN I: not tested CN II: pupils equal, round and reactive to light, visual fields intact CN III, IV, VI:  full range of motion, no nystagmus, no ptosis CN V: facial sensation  intact CN VII: upper and lower face symmetric CN VIII: hearing intact CN IX, X: gag intact, uvula midline CN XI: sternocleidomastoid and trapezius muscles intact CN XII: tongue midline Bulk & Tone: normal, no fasciculations. Motor:  5/5 throughout  Sensation:  temperature and vibration sensation intact.  Deep Tendon Reflexes:  2+ throughout, toes downgoing.   Finger to nose testing:  Without dysmetria.   Heel to shin:  Without dysmetria.   Gait:  Normal station and stride.  Able to turn and tandem walk. Romberg negative.  IMPRESSION: Phantosmia.  Differential diagnosis may include temporal lobe seizures or migraine aura.  However, her symptoms are persistent and only noticeable in her home.    PLAN: 1.  Check MRI of brain with and without contrast to evaluate for an intracranial etiology 2.  Will check routine EEG.  If unremarkable, will likely check 24 hour ambulatory EEG. 3.  Follow up after testing.  Further recommendations pending results.  Thank you for allowing me to take part in the care of this patient.  Metta Clines, DO  CC: Jonathon Jordan, MD

## 2018-12-19 ENCOUNTER — Ambulatory Visit: Payer: Commercial Managed Care - PPO | Admitting: Neurology

## 2018-12-19 ENCOUNTER — Other Ambulatory Visit: Payer: Self-pay

## 2018-12-19 ENCOUNTER — Encounter: Payer: Self-pay | Admitting: Neurology

## 2018-12-19 VITALS — BP 137/77 | HR 56 | Ht 65.0 in | Wt 185.6 lb

## 2018-12-19 DIAGNOSIS — R442 Other hallucinations: Secondary | ICD-10-CM | POA: Diagnosis not present

## 2018-12-19 NOTE — Patient Instructions (Addendum)
1.  We will check MRI of brain with and without contrast 2.  We will also check a routine EEG 3.  Follow up after testing.   Further recommendations pending results.  A referral to [facility] has been placed for your MRI someone will contact you directly to schedule your appt. They are located at [place]. Please contact them directly by calling [phone] with any questions regarding your referral.

## 2018-12-20 ENCOUNTER — Ambulatory Visit (INDEPENDENT_AMBULATORY_CARE_PROVIDER_SITE_OTHER): Payer: Commercial Managed Care - PPO | Admitting: Neurology

## 2018-12-20 ENCOUNTER — Telehealth: Payer: Self-pay

## 2018-12-20 DIAGNOSIS — R442 Other hallucinations: Secondary | ICD-10-CM

## 2018-12-20 NOTE — Telephone Encounter (Signed)
-----   Message from Pieter Partridge, DO sent at 12/20/2018 12:45 PM EST ----- EEG is normal.  I would like to order a 24 hour ambulatory EEG for phantosmia

## 2018-12-20 NOTE — Telephone Encounter (Signed)
Spoke with she was informed and understands results. Orders placed

## 2018-12-20 NOTE — Procedures (Signed)
ELECTROENCEPHALOGRAM REPORT  Date of Study: 12/20/2018  Patient's Name: Donna Grant MRN: [id number] Date of Birth: 03-05-1960  Referring Provider: Metta Clines, DO  Clinical History: 58 year old female with phantosmia.  Medications: Crestor Camera operator Summary: A multichannel digital EEG recording measured by the international 10-20 system with electrodes applied with paste and impedances below 5000 ohms performed in our laboratory with EKG monitoring in an awake patient.  Hyperventilation not performed as patient wearing mask for COVID-19 pandemic.  Photic stimulation was performed.  The digital EEG was referentially recorded, reformatted, and digitally filtered in a variety of bipolar and referential montages for optimal display.    Description: The patient is awake during the recording.  During maximal wakefulness, there is a symmetric, medium voltage 10 Hz posterior dominant rhythm that attenuates with eye opening.  The record is symmetric.  Stage 2 sleep was not seen.  Photic stimulation did not elicit any abnormalities.  There were no epileptiform discharges or electrographic seizures seen.    EKG lead was unremarkable.  Impression: This awake EEG is normal.    Clinical Correlation: A normal EEG does not exclude a clinical diagnosis of epilepsy.  If further clinical questions remain, prolonged EEG may be helpful.  Clinical correlation is advised.   Metta Clines, DO

## 2019-01-08 ENCOUNTER — Other Ambulatory Visit: Payer: Commercial Managed Care - PPO

## 2019-01-09 ENCOUNTER — Other Ambulatory Visit: Payer: Self-pay

## 2019-01-09 ENCOUNTER — Ambulatory Visit (INDEPENDENT_AMBULATORY_CARE_PROVIDER_SITE_OTHER): Payer: Commercial Managed Care - PPO | Admitting: Neurology

## 2019-01-09 DIAGNOSIS — R442 Other hallucinations: Secondary | ICD-10-CM

## 2019-01-13 NOTE — Procedures (Signed)
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  The digital EEG was referentially recorded, reformatted, and digitally filtered in a variety of bipolar and referential montages for optimal display.    DESCRIPTION OF RECORDING: During maximal wakefulness, the background activity consisted of a symmetric 11Hz  posterior dominant rhythm which was reactive to eye opening.  There were no epileptiform discharges or focal slowing seen in wakefulness.  During the recording, the patient progresses through wakefulness, drowsiness, and Stage 2 sleep.  Again, there were no epileptiform discharges seen.  Events:  From 12:00 to 12:05 and at 19:55, patient endorsed experiencing her habitual spells.  There were no corresponding electrographic seizures on EEG.  EKG lead was unremarkable.  IMPRESSION: This 24-hour ambulatory EEG study is normal.    CLINICAL CORRELATION: While there was no electrographic correlate with her habitual spells, simple partial seizures may have surface-negative EEGs.  Clinical correlation is advised.   Metta Clines, DO

## 2019-01-15 ENCOUNTER — Other Ambulatory Visit: Payer: Self-pay

## 2019-01-15 ENCOUNTER — Ambulatory Visit
Admission: RE | Admit: 2019-01-15 | Discharge: 2019-01-15 | Disposition: A | Discharge status: Home / Self Care | Payer: Commercial Managed Care - PPO | Source: Ambulatory Visit | Attending: Neurology | Admitting: Neurology

## 2019-01-15 DIAGNOSIS — R442 Other hallucinations: Secondary | ICD-10-CM

## 2019-01-15 MED ORDER — GADOBENATE DIMEGLUMINE 529 MG/ML IV SOLN
17.0000 mL | Freq: Once | INTRAVENOUS | Status: AC | PRN
  Administered 2019-01-15: 17 mL via INTRAVENOUS

## 2019-01-16 ENCOUNTER — Encounter: Payer: Self-pay | Admitting: Neurology

## 2019-01-16 NOTE — Progress Notes (Signed)
Virtual Visit via Video Note The purpose of this virtual visit is to provide medical care while limiting exposure to the novel coronavirus.    Consent was obtained for video visit:  Yes.   Answered questions that patient had about telehealth interaction:  Yes.   I discussed the limitations, risks, security and privacy concerns of performing an evaluation and management service by telemedicine. I also discussed with the patient that there may be a patient responsible charge related to this service. The patient expressed understanding and agreed to proceed.  Pt location: Home Physician Location: office Name of referring provider:  Jonathon Jordan, MD I connected with Donna Grant at patients initiation/request on 01/20/2019 at  9:50 AM EST by video enabled telemedicine application and verified that I am speaking with the correct person using two identifiers. Pt MRN:  [id number] Pt DOB:  [birth year] Video Participants:  Donna Grant   History of Present Illness:  Donna Grant is a 58 year old female with migraines who follows up for phantosmia.  UPDATE: 24 hour ambulatory EEG from 12/10-12/11 captured habitual spells with no electrographic correlate.  MRI of brain with and without contrast from 01/15/2019 showed 2 punctate hyperintensities in the right frontal white matter but overall unremarkable.  HISTORY: In September, she started smelling cigarette smoke all of the time, only in the house.  It was like she was in a smoky bar.  Her throat would get sore.  Intensity fluctuate.  She was on prednisone at the time after injuring her back and it started after finishing the taper.  No preceding head trauma or illness.  At the time, she had headaches (a pressure in the back of head to temples) which have since resolved 3 1/2 weeks ago.  They smell is present in all of the rooms.  It appears more prominent when she is sitting.  At first, she only noticed it in the evenings but gradually became  all of the time.  The only time she cannot smell it at home is when she is taking a shower.  Her husband doesn't smell anything.  She tried looking for a source but couldn't find one.  She doesn't smell it outside the house, whether it is outside, in her car or at the grocery store.  She has been quarantined at home since the start of the pandemic and has not been to anybody else's home.  She does not smell it here in the office.  At first, she had associated hyperacuity of all smell, where everything smelled strong, but this has since resolved.  She denies visual disturbance, altered taste or paresthesias.  She was evaluated by ENT, who did not find anything.   In retrospect, she reports that back in March, she had upper respiratory symptoms and uncertain if it may have been COVID since there was no testing at that time.  In the early 1980s, she was treated for migraines but they subsequently stopped.  No history of seizures or head trauma.    Past Medical History: Past Medical History:  Diagnosis Date  . Anemia   . Blood transfusion   . Heart murmur   . Hyperlipidemia   . Migraine headache     Medications: Outpatient Encounter Medications as of 01/20/2019  Medication Sig  . Calcium-Phosphorus-Vitamin D (CITRACAL +D3 PO) Take by mouth. 600 mg-500 IU  . fish oil-omega-3 fatty acids 1000 MG capsule Take 1 g by mouth daily.    . metoprolol tartrate (LOPRESSOR) 50 MG tablet  Take 1 tablet (50 mg total) by mouth once for 1 dose.  . Multiple Vitamins-Minerals (MULTIVITAMIN WITH MINERALS) tablet Take 1 tablet by mouth daily.    . nitroGLYCERIN (NITROSTAT) 0.4 MG SL tablet Place 1 tablet (0.4 mg total) under the tongue every 5 (five) minutes as needed for chest pain.  . rosuvastatin (CRESTOR) 20 MG tablet Take 20 mg by mouth daily.     No facility-administered encounter medications on file as of 01/20/2019.    Allergies: Allergies  Allergen Reactions  . Other     Family History: Family  History  Problem Relation Age of Onset  . Cancer Mother 53       brain and lung   . Diabetes Brother   . Kidney failure Brother   . Stroke Brother     Social History: Social History   Socioeconomic History  . Marital status: Married    Spouse name: Not on file  . Number of children: 0  . Years of education: Not on file  . Highest education level: Bachelor's degree (e.g., BA, AB, BS)  Occupational History  . Not on file  Tobacco Use  . Smoking status: Never Smoker  . Smokeless tobacco: Never Used  Substance and Sexual Activity  . Alcohol use: Yes  . Drug use: No  . Sexual activity: Not on file  Other Topics Concern  . Not on file  Social History Narrative   Pt is married she lives with her spouse in 1 story home   She has no children, right handed   bachelors degree   Drinks coffee once a day, tea at night 1 cup, no soda   Social Determinants of Health   Financial Resource Strain:   . Difficulty of Paying Living Expenses: Not on file  Food Insecurity:   . Worried About Charity fundraiser in the Last Year: Not on file  . Ran Out of Food in the Last Year: Not on file  Transportation Needs:   . Lack of Transportation (Medical): Not on file  . Lack of Transportation (Non-Medical): Not on file  Physical Activity:   . Days of Exercise per Week: Not on file  . Minutes of Exercise per Session: Not on file  Stress:   . Feeling of Stress : Not on file  Social Connections:   . Frequency of Communication with Friends and Family: Not on file  . Frequency of Social Gatherings with Friends and Family: Not on file  . Attends Religious Services: Not on file  . Active Member of Clubs or Organizations: Not on file  . Attends Archivist Meetings: Not on file  . Marital Status: Not on file  Intimate Partner Violence:   . Fear of Current or Ex-Partner: Not on file  . Emotionally Abused: Not on file  . Physically Abused: Not on file  . Sexually Abused: Not on file     Observations/Objective:   Height 5\' 6"  (1.676 m), weight 185 lb (83.9 kg). No acute distress.  Alert and oriented.  Speech fluent and not dysarthric.  Language intact.  Eyes orthophoric on primary gaze.  Face symmetric.  Assessment and Plan:   1. Recurrent phantosmia.  Consider simple partial seizures.  While EEG captured 2 spells without any seizure activity noted, such seizures are often EEG surface negative.  Therefore, seizure is not ruled out.  There is a question if she may have had COVID in March.  Her PCP reportedly has a patient with established  history of COVID that also smells smoke following his infection.  This may be residual symptoms of that as well.  I suggested treating empirically for seizure but she defers at this time.  If she has more frequent or new/worsening symptoms, she will contact me.  Follow Up Instructions:    -I discussed the assessment and treatment plan with the patient. The patient was provided an opportunity to ask questions and all were answered. The patient agreed with the plan and demonstrated an understanding of the instructions.   The patient was advised to call back or seek an in-person evaluation if the symptoms worsen or if the condition fails to improve as anticipated.    Total Time spent in visit with the patient was:  15 minutes     Dudley Major, DO

## 2019-01-20 ENCOUNTER — Other Ambulatory Visit: Payer: Self-pay

## 2019-01-20 ENCOUNTER — Telehealth (INDEPENDENT_AMBULATORY_CARE_PROVIDER_SITE_OTHER): Payer: Commercial Managed Care - PPO | Admitting: Neurology

## 2019-01-20 VITALS — Ht 66.0 in | Wt 185.0 lb

## 2019-01-20 DIAGNOSIS — R442 Other hallucinations: Secondary | ICD-10-CM

## 2019-03-11 ENCOUNTER — Ambulatory Visit: Payer: Commercial Managed Care - PPO | Attending: Internal Medicine

## 2019-03-11 DIAGNOSIS — Z23 Encounter for immunization: Secondary | ICD-10-CM | POA: Insufficient documentation

## 2019-03-11 NOTE — Progress Notes (Signed)
   Covid-19 Vaccination Clinic  Name:  Zeyah Cobo    MRN: [id number] DOB: 03-Dec-1960  03/11/2019  Ms. Goodness was observed post Covid-19 immunization for 15 minutes without incidence. She was provided with Vaccine Information Sheet and instruction to access the V-Safe system.   Ms. Elkind was instructed to call 911 with any severe reactions post vaccine: Marland Kitchen Difficulty breathing  . Swelling of your face and throat  . A fast heartbeat  . A bad rash all over your body  . Dizziness and weakness    Immunizations Administered    Name Date Dose VIS Date Route   Pfizer COVID-19 Vaccine 03/11/2019 11:53 AM 0.3 mL 01/10/2019 Intramuscular   Manufacturer: [place]   Lot: [id number]   [place]: [id number]

## 2019-03-13 ENCOUNTER — Ambulatory Visit: Payer: Commercial Managed Care - PPO

## 2019-04-04 ENCOUNTER — Ambulatory Visit: Payer: Commercial Managed Care - PPO | Attending: Internal Medicine

## 2019-04-04 DIAGNOSIS — Z23 Encounter for immunization: Secondary | ICD-10-CM | POA: Insufficient documentation

## 2019-04-04 NOTE — Progress Notes (Signed)
   Covid-19 Vaccination Clinic  Name:  Donna Grant    MRN: [id number] DOB: 08-16-1960  04/04/2019  [name] was observed post Covid-19 immunization for 15 minutes without incident. She was provided with Vaccine Information Sheet and instruction to access the V-Safe system.   [name] was instructed to call 911 with any severe reactions post vaccine: Marland Kitchen Difficulty breathing  . Swelling of face and throat  . A fast heartbeat  . A bad rash all over body  . Dizziness and weakness   Immunizations Administered    Name Date Dose VIS Date Route   Pfizer COVID-19 Vaccine 04/04/2019 12:55 PM 0.3 mL 01/10/2019 Intramuscular   Manufacturer: [place]   Lot: [id number]   [place]: [id number]

## 2021-09-29 IMAGING — MR MR HEAD WO/W CM
6 of 11 series · 18 of 48 positions shown · IV contrast (multihance)
Comparison: None.

CLINICAL DATA: Phantosmia

EXAM:
MRI HEAD WITHOUT AND WITH CONTRAST
TECHNIQUE: Multiplanar, multiecho pulse sequences of the brain and surrounding
structures were obtained without and with intravenous contrast.
CONTRAST:  17mL MULTIHANCE GADOBENATE DIMEGLUMINE 529 MG/ML IV SOLN

[Series 3: T2 · axial · 5.0mm · 0.51mm/px · 1 of 22 slices shown]
[im 1/22]
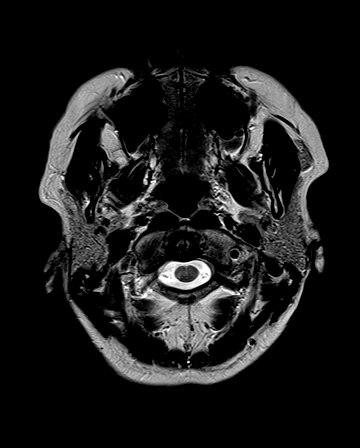

[Series 4: DWI · axial · 3.0mm · 1.80mm/px · z∈[-29,+106]mm · 7 of 92 slices shown (1 of 2)]
[im 1/92]
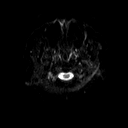
[im 16/92]
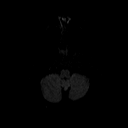
[im 31/92]
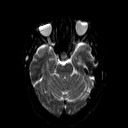
[im 46/92]
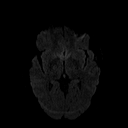
[im 61/92]
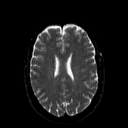
[im 76/92]
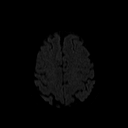
[im 92/92]
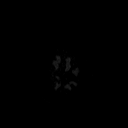

[Series 5: DWI · axial · 3.0mm · 1.80mm/px · z∈[-29,+106]mm · 3 of 46 slices shown (2 of 2)]
[im 1/46]
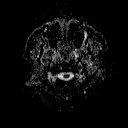
[im 23/46]
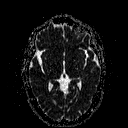
[im 46/46]
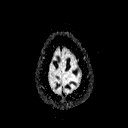

[Series 9: FLAIR · axial · 3.0mm · 0.45mm/px · z∈[-29,+106]mm · 2 of 30 slices shown]
[im 1/30]
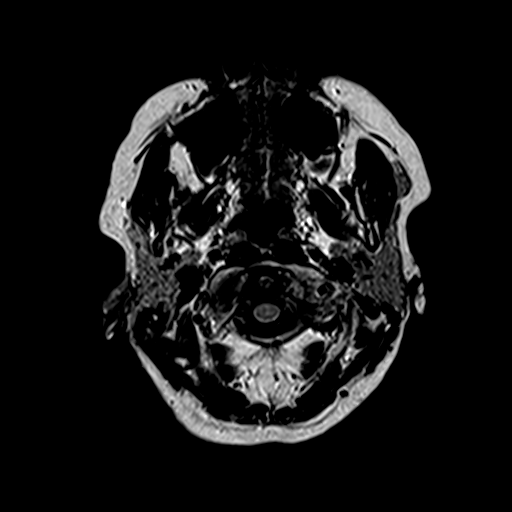
[im 30/30]
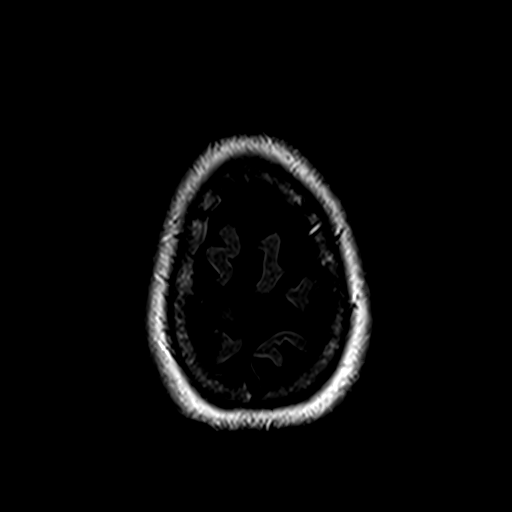

[Series 10: T2 fat-sat · axial · 3.0mm · 0.20mm/px · z∈[-50,+21]mm · 2 of 25 slices shown (1 of 2)]
[im 1/25]
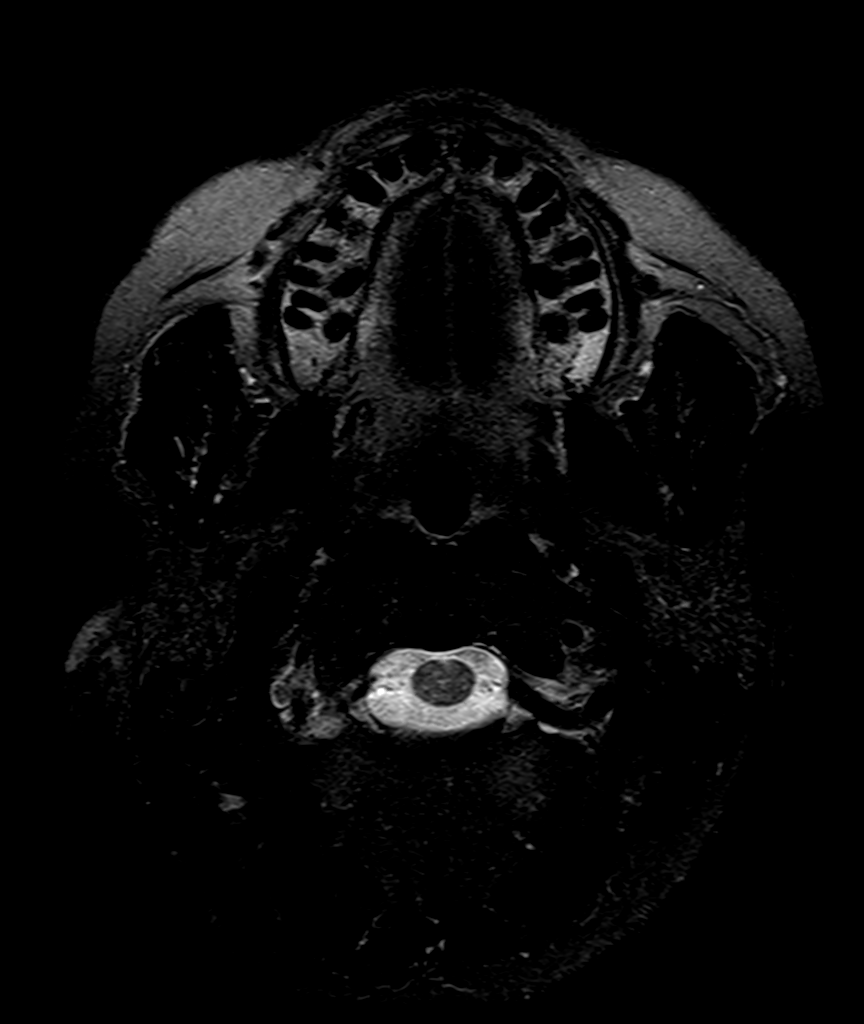
[im 25/25]
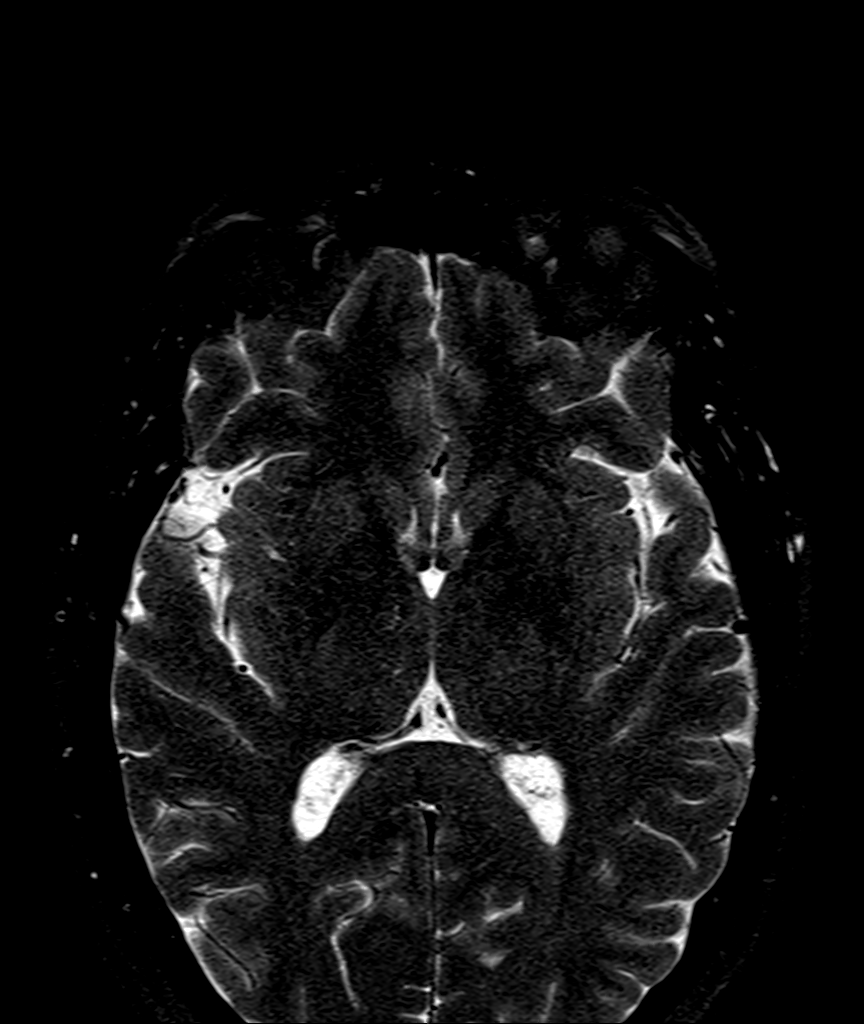

[Series 11: T2 fat-sat · coronal · 3.0mm · 0.20mm/px · 3 of 36 slices shown (2 of 2)]
[im 1/36]
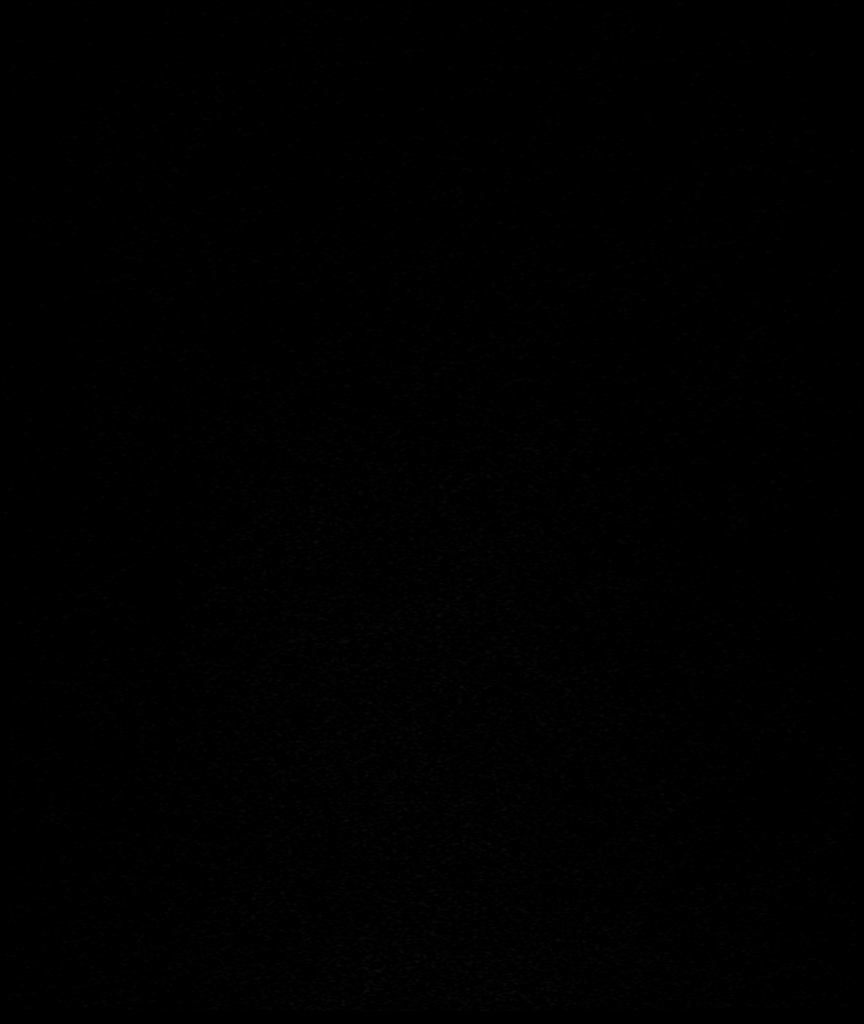
[im 18/36]
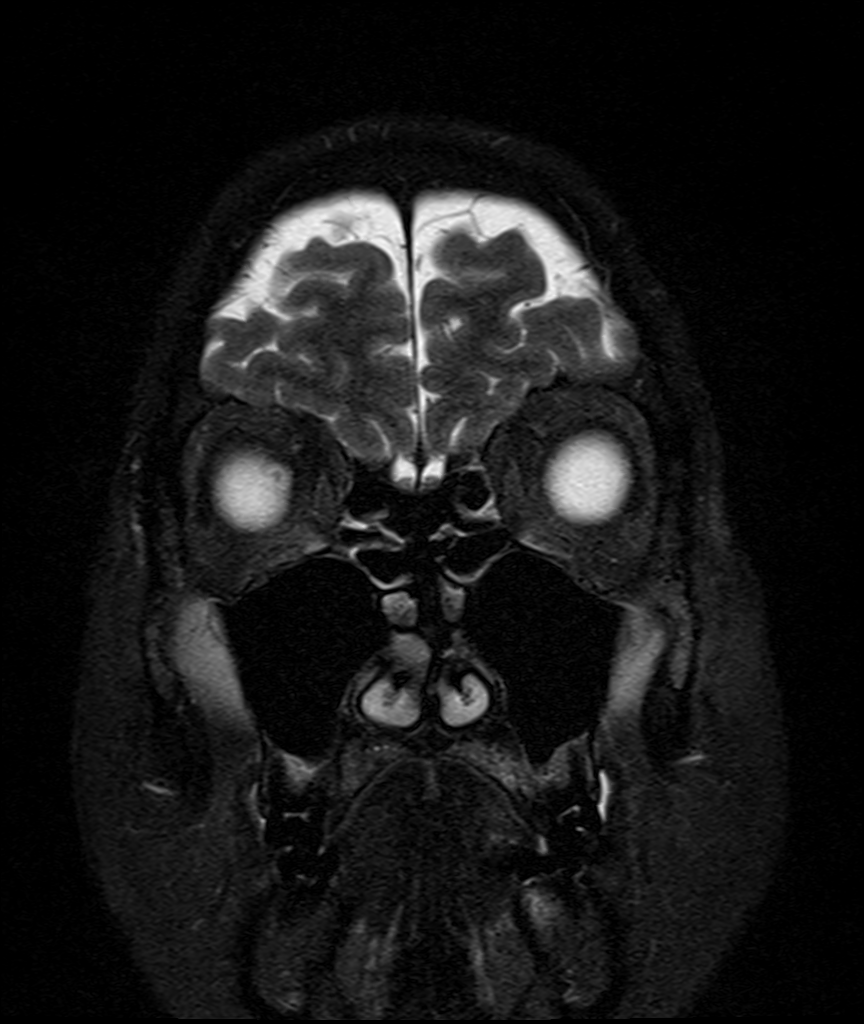
[im 36/36]
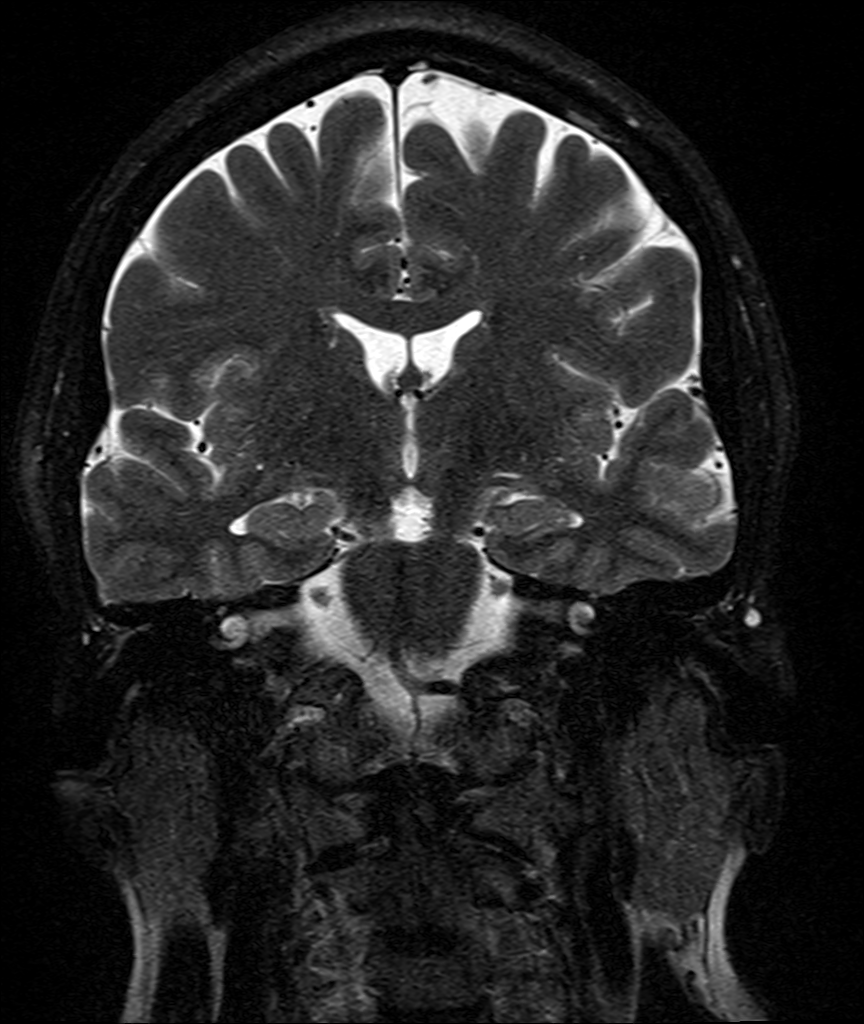

[18 of 48 positions shown; findings below may reference images not displayed]

FINDINGS: Brain: Ventricle size normal. Cerebral volume normal. Negative for
acute infarct. Small hyperintensities in the right frontal deep
white matter. Approximately 2 lesions are identified. Meaning white
matter normal. Negative for hemorrhage or mass.

Normal enhancement postcontrast infusion

Thin sections through the olfactory bulb appear normal. No mass
lesion.

Vascular: Normal arterial flow voids.

Skull and upper cervical spine: Negative

Sinuses/Orbits: Mild mucosal edema in the ethmoid sinus bilaterally.
Remaining sinuses clear. Normal orbit and optic nerve.

Other: Not
IMPRESSION: Olfactory bulb appears normal bilaterally.  No mass lesion.

Two small hyperintensities in the right frontal white matter likely
due to chronic ischemia or complex migraine headache. No acute
intracranial abnormality

## 2022-06-24 ENCOUNTER — Emergency Department (HOSPITAL_BASED_OUTPATIENT_CLINIC_OR_DEPARTMENT_OTHER)
Admission: EM | Admit: 2022-06-24 | Discharge: 2022-06-24 | Disposition: A | Discharge status: Home / Self Care | Payer: Commercial Managed Care - PPO | Attending: Emergency Medicine | Admitting: Emergency Medicine

## 2022-06-24 ENCOUNTER — Encounter (HOSPITAL_BASED_OUTPATIENT_CLINIC_OR_DEPARTMENT_OTHER): Payer: Self-pay | Admitting: Emergency Medicine

## 2022-06-24 ENCOUNTER — Emergency Department (HOSPITAL_BASED_OUTPATIENT_CLINIC_OR_DEPARTMENT_OTHER): Payer: Commercial Managed Care - PPO

## 2022-06-24 DIAGNOSIS — D72829 Elevated white blood cell count, unspecified: Secondary | ICD-10-CM | POA: Insufficient documentation

## 2022-06-24 DIAGNOSIS — K529 Noninfective gastroenteritis and colitis, unspecified: Secondary | ICD-10-CM | POA: Diagnosis not present

## 2022-06-24 DIAGNOSIS — R1030 Lower abdominal pain, unspecified: Secondary | ICD-10-CM

## 2022-06-24 LAB — URINALYSIS, ROUTINE W REFLEX MICROSCOPIC
Bilirubin Urine: NEGATIVE
Glucose, UA: NEGATIVE mg/dL
Hgb urine dipstick: NEGATIVE
Ketones, ur: NEGATIVE mg/dL
Leukocytes,Ua: NEGATIVE
Nitrite: NEGATIVE
Protein, ur: NEGATIVE mg/dL
Specific Gravity, Urine: 1.019 (ref 1.005–1.030)
pH: 7 (ref 5.0–8.0)

## 2022-06-24 LAB — CBC
HCT: 41.8 % (ref 36.0–46.0)
Hemoglobin: 14.1 g/dL (ref 12.0–15.0)
MCH: 30.9 pg (ref 26.0–34.0)
MCHC: 33.7 g/dL (ref 30.0–36.0)
MCV: 91.5 fL (ref 80.0–100.0)
Platelets: 197 10*3/uL (ref 150–400)
RBC: 4.57 MIL/uL (ref 3.87–5.11)
RDW: 13 % (ref 11.5–15.5)
WBC: 12 10*3/uL — ABNORMAL HIGH (ref 4.0–10.5)
nRBC: 0 % (ref 0.0–0.2)

## 2022-06-24 LAB — COMPREHENSIVE METABOLIC PANEL
ALT: 23 U/L (ref 0–44)
AST: 25 U/L (ref 15–41)
Albumin: 4.6 g/dL (ref 3.5–5.0)
Alkaline Phosphatase: 28 U/L — ABNORMAL LOW (ref 38–126)
Anion gap: 7 (ref 5–15)
BUN: 11 mg/dL (ref 8–23)
CO2: 28 mmol/L (ref 22–32)
Calcium: 9.5 mg/dL (ref 8.9–10.3)
Chloride: 102 mmol/L (ref 98–111)
Creatinine, Ser: 0.69 mg/dL (ref 0.44–1.00)
GFR, Estimated: >60 mL/min (ref >60)
Glucose, Bld: 95 mg/dL (ref 70–99)
Potassium: 4.6 mmol/L (ref 3.5–5.1)
Sodium: 137 mmol/L (ref 135–145)
Total Bilirubin: 0.7 mg/dL (ref 0.3–1.2)
Total Protein: 7.4 g/dL (ref 6.5–8.1)

## 2022-06-24 LAB — LIPASE, BLOOD: Lipase: 22 U/L (ref 11–51)

## 2022-06-24 MED ORDER — METRONIDAZOLE 500 MG PO TABS: 500.0000 mg | ORAL_TABLET | Freq: Two times a day (BID) | ORAL | 0 refills | Status: AC

## 2022-06-24 MED ORDER — CIPROFLOXACIN HCL 500 MG PO TABS: 500.0000 mg | ORAL_TABLET | Freq: Two times a day (BID) | ORAL | 0 refills | Status: AC

## 2022-06-24 MED ORDER — ONDANSETRON HCL 4 MG PO TABS: 4.0000 mg | ORAL_TABLET | Freq: Four times a day (QID) | ORAL | 0 refills | Status: AC | PRN

## 2022-06-24 MED ORDER — IOHEXOL 300 MG/ML  SOLN
80.0000 mL | Freq: Once | INTRAMUSCULAR | Status: AC | PRN
  Administered 2022-06-24: 80 mL via INTRAVENOUS

## 2022-06-24 MED ORDER — ACETAMINOPHEN 500 MG PO TABS
1000.0000 mg | ORAL_TABLET | Freq: Once | ORAL | Status: AC
  Administered 2022-06-24: 1000 mg via ORAL
  Filled 2022-06-24: qty 2

## 2022-06-24 NOTE — Discharge Instructions (Addendum)
It was a pleasure taking care of you today!  Your labs showed slightly elevated white blood cell count otherwise unremarkable.  Your CT scan showed concerns for colitis as well as possible concern for a neoplasm to the descending colon.  This can be evaluated further with your GI specialist.  You will be sent a prescription for Zofran, take as directed if you are experiencing nausea/vomiting.  If you have to take Zofran, wait at least 30 minutes before you attempt small sips/small bites of food/fluid.  Ensure to maintain fluid intake with water, tea, broth, soup, Pedialyte, Gatorade.  He also be sent a prescription for ciprofloxacin and Flagyl, while taking his medications you may want to take over-the-counter probiotics or consume Activia yogurt.  Follow-up with your primary care provider as needed regarding this ED visit.  Call your GI specialist at [facility] gastroenterology to set up a follow-up appointment regarding today's ED visit.  Attached is information for the on-call oncologist, Dr. Leonides Schanz, you may call and set up a follow-up appointment regarding today's ED visit.  Return to the emergency department if you experience increasing/worsening symptoms.

## 2022-06-24 NOTE — ED Provider Notes (Signed)
[facility] AT [facility] Provider Note   CSN: [id number] Arrival date & time: 06/24/22  1135     History  Chief Complaint  Patient presents with   Abdominal Pain    Donna Grant is a 62 y.o. female who presents to the ED with concerns for lower abdominal pain x last night. Notes that her pain has been constant since last night when she ate dinner. She last ate a banana at 9 AM. Also notes constipation 2 weeks ago and since has had small hard stools. This morning had thin soft stools. History of descending colectomy and appendectomy in 2012 due to precancerous polyp. Was seen at [facility] in [place] and sent to the ED due to concerns for obstruction noted on KUB. Has associated constipation, nausea x last night and resolved at this time. Denies chest pain, shortness of breath, urinary symptoms, fever, blood in stool, diarrhea, vomiting.   The history is provided by the patient. No language interpreter was used.       Home Medications Prior to Admission medications   Medication Sig Start Date End Date Taking? Authorizing Provider  Calcium-Phosphorus-Vitamin D (CITRACAL +D3 PO) Take by mouth. 600 mg-500 IU    [provider]  Cholecalciferol (VITAMIN D) 50 MCG (2000 UT) tablet Take 2,000 Units by mouth daily.    [provider]  fish oil-omega-3 fatty acids 1000 MG capsule Take 1 g by mouth daily.      [provider]  metoprolol tartrate (LOPRESSOR) 50 MG tablet Take 1 tablet (50 mg total) by mouth once for 1 dose. 01/03/18 01/03/18  Barrett, Joline Salt, PA-C  Multiple Vitamins-Minerals (MULTIVITAMIN WITH MINERALS) tablet Take 1 tablet by mouth daily.      [provider]  nitroGLYCERIN (NITROSTAT) 0.4 MG SL tablet Place 1 tablet (0.4 mg total) under the tongue every 5 (five) minutes as needed for chest pain. 02/20/18 05/21/18  Barrett, Joline Salt, PA-C  rosuvastatin (CRESTOR) 20 MG tablet Take 20 mg by mouth daily.      [provider]      Allergies    Other    Review of Systems   Review of Systems  Constitutional:  Negative for fever.  Respiratory:  Negative for shortness of breath.   Cardiovascular:  Negative for chest pain.  Gastrointestinal:  Positive for abdominal pain, constipation and nausea (last night, resolved). Negative for blood in stool, diarrhea and vomiting.  Genitourinary:  Negative for dysuria and hematuria.  All other systems reviewed and are negative.   Physical Exam Updated Vital Signs BP 133/84 (BP Location: Right Arm)   Pulse 68   Temp 99 F (37.2 C) (Oral)   Resp 16   SpO2 98%  Physical Exam Vitals and nursing note reviewed.  Constitutional:      General: She is not in acute distress.    Appearance: She is not diaphoretic.  HENT:     Head: Normocephalic and atraumatic.     Mouth/Throat:     Pharynx: No oropharyngeal exudate.  Eyes:     General: No scleral icterus.    Conjunctiva/sclera: Conjunctivae normal.  Cardiovascular:     Rate and Rhythm: Normal rate and regular rhythm.     Pulses: Normal pulses.     Heart sounds: Normal heart sounds.  Pulmonary:     Effort: Pulmonary effort is normal. No respiratory distress.     Breath sounds: Normal breath sounds. No wheezing.  Abdominal:     General:  Bowel sounds are normal.     Palpations: Abdomen is soft. There is no mass.     Tenderness: There is abdominal tenderness in the right lower quadrant, suprapubic area and left lower quadrant. There is no guarding or rebound.     Comments: Lower abdominal TTP.   Musculoskeletal:        General: Normal range of motion.     Cervical back: Normal range of motion and neck supple.  Skin:    General: Skin is warm and dry.  Neurological:     Mental Status: She is alert.  Psychiatric:        Behavior: Behavior normal.     ED Results / Procedures / Treatments   Labs (all labs ordered are listed, but only abnormal results are displayed) Labs Reviewed  COMPREHENSIVE  METABOLIC PANEL - Abnormal; Notable for the following components:      Result Value   Alkaline Phosphatase 28 (*)    All other components within normal limits  CBC - Abnormal; Notable for the following components:   WBC 12.0 (*)    All other components within normal limits  LIPASE, BLOOD  URINALYSIS, ROUTINE W REFLEX MICROSCOPIC    EKG None  Radiology No results found.  Procedures Procedures    Medications Ordered in ED Medications - No data to display  ED Course/ Medical Decision Making/ A&P Clinical Course as of 06/24/22 2319  Sat Jun 24, 2022  1715 Discussed with patient and husband at bedside regarding lab and CT findings. Pt notes that she does have a GI specialist with [facility] with her last colonoscopy being in 2012. Discussed with patient discharge treatment plan. Answered all available questions. Pt appears safe for discharge at this time.  [SB]    Clinical Course User Index [SB] Amirr Achord A, PA-C   `                          Medical Decision Making Amount and/or Complexity of Data Reviewed Labs: ordered. Radiology: ordered.  Risk OTC drugs. Prescription drug management.   Patient presents to the emergency department with lower abdominal pain x last night. Pt afebrile. On exam patient with lower abdominal TTP. Remainder of exam without acute findings.  Differential diagnosis includes diverticulitis, pancreatitis, cholecystitis, appendicitis, acute cystitis.   Labs:  I ordered, and personally interpreted labs.  The pertinent results include:  Lipase unremarkable Urinalysis unremarkable CMP unremarkable CBC with mildly elevated leukocytosis otherwise unremarkable  Imaging: I ordered imaging studies including CT abdomen pelvis with I independently visualized and interpreted imaging which showed  1. Asymmetric mucosal thickening of short segment of the descending  colon with associated mild pericolonic inflammatory changes. This  may represent  colitis, however underlying neoplasm is high in the  differential diagnosis. Further evaluation with colonoscopy may be  considered.  2. Cholelithiasis without evidence of acute cholecystitis.  3. Aortic atherosclerosis.   I agree with the radiologist interpretation  Medications:  I ordered medication including tylenol for symptom management.  I have reviewed the patients home medicines and have made adjustments as needed   Disposition: Presenting suspicious for lower abdominal pain, likely concern for colitis.  Also incidental finding on CT scan with concerns for underlying neoplasm placed high on the differential diagnoses.  Doubt concerns at this time for diverticulitis, pancreatitis, cholecystitis, acute cystitis. After consideration of the diagnostic results and the patients response to treatment, I feel that the patient would benefit from  Discharge home.  Patient sent with a prescription for Cipro, Flagyl, Zofran.  Patient provided with information for oncology for follow-up regarding today's ED visit.  Patient instructed to call her gastroenterologist to set up a follow-up appointment for colonoscopy regarding today's ED visit.  Supportive care measures and strict return precautions discussed with patient at bedside. Pt acknowledges and verbalizes understanding. Pt appears safe for discharge. Follow up as indicated in discharge paperwork.   This chart was dictated using voice recognition software, Dragon. Despite the best efforts of this provider to proofread and correct errors, errors may still occur which can change documentation meaning.  Final Clinical Impression(s) / ED Diagnoses Final diagnoses:  Abdominal pain, lower  Colitis    Rx / DC Orders ED Discharge Orders     None         Quindarius Cabello A, PA-C 06/24/22 2321    Mardene Sayer, MD 06/26/22 1025

## 2022-06-24 NOTE — ED Triage Notes (Signed)
Pt presents to ED POV. Pt c/o abd pain, lower back pain, and fever that began last night. Pt reports that she was sent here from [facility] d/t abn xray showing possible obstruction. Pt reports she is able to hold food and drink down.

## 2022-06-24 NOTE — ED Notes (Signed)
Transport to CT

## 2022-06-24 NOTE — ED Notes (Signed)
Dc instructions reviewed with patient. Patient voiced understanding. Dc with belongings.  °

## 2022-06-27 ENCOUNTER — Telehealth: Payer: Self-pay

## 2022-06-27 NOTE — Telephone Encounter (Signed)
This RN called and spoke with patient regarding voicemail left about seeing an oncologist. Patient was seen at the ER on 5/25 and was found to have an abdominal mass- per patient, she was instructed to follow up with Dr. Leonides Schanz. This RN reached out to the diagnostic team and found that patient is to follow up with gastroenterology, who will put in the referral for diagnostic clinic based on their findings. This RN informed patient and she verbalized understanding. RN called [facility] gastroenterology and confirmed that there is an urgent referral in place for patient- they report that they are trying to work her in for an appointment tomorrow and will reach out to patient.

## 2023-05-21 ENCOUNTER — Ambulatory Visit: Payer: Self-pay | Admitting: Podiatry

## 2023-08-13 ENCOUNTER — Ambulatory Visit: Admitting: Podiatry

## 2023-08-13 VITALS — Ht 65.5 in | Wt 170.0 lb

## 2023-08-13 DIAGNOSIS — B351 Tinea unguium: Secondary | ICD-10-CM | POA: Diagnosis not present

## 2023-08-13 DIAGNOSIS — L600 Ingrowing nail: Secondary | ICD-10-CM | POA: Diagnosis not present

## 2023-08-13 NOTE — Patient Instructions (Signed)

## 2023-08-14 NOTE — Progress Notes (Signed)
 Subjective:  Patient ID: Donna Grant, female    DOB: 1960/07/07,  MRN: [id number]  Chief Complaint  Patient presents with   Nail Problem    RM 12 Patient is here for discoloration and thickening of left great toe.  Patient states second incident with toe nail becoming discolored( Patient provided paperwork).    Discussed the use of AI scribe software for clinical note transcription with the patient, who gave verbal consent to proceed.  History of Present Illness Donna Grant is a 63 year old female who presents with chronic toenail thickening and discoloration causing discomfort.  The toenail has thickened and discolored over time, initially turning black after a rock was dropped on it. Six months later, a sledgehammer caused further discoloration to purple, black, and yellow, with increased thickness. The toenail is painful when touched or rubbed against objects, such as socks or sheets, and causes discomfort when wearing shoes. There is no drainage, but she often uses a Band-Aid due to its appearance and prefers wearing sandals. She is concerned about how this issue might affect her cycling activities, especially with an upcoming trip. After previous toenail removal, regrowth took about nine months, resulting in a thin and not fully solid nail.      Objective:    Physical Exam Dermatological: on the left hallux, the nails are hypertrophic, dystrophic with yellow, brown discoloration.  There is still some dried blood present under the toenail.  No extensive pigmentation in the surrounding skin or nailbed.  Localized edema but there is no erythema, drainage or pus or other signs of infection.  Vascular: DP, PT pulses 2/4, CRT less than 3 seconds.  Neurological: Sensation intact  Musculoskeletal: No other areas of discomfort except for the toenail.  2016 culture      Results Procedure: Toenail removal   Description: Application of cold spray for anesthesia, injection of local  anesthetic into the toe, removal of toenail, sending toenail for culture.   Assessment:   1. Onychomycosis   2. Ingrown toenail      Plan:  Patient was evaluated and treated and all questions answered.  Assessment and Plan Assessment & Plan Onychomycosis with repeated trauma Chronic onychomycosis with trauma causing thickening, discoloration, and discomfort. Previous nail removal. No current infection noted. Nail removal indicated for discomfort relief and potential healthier regrowth. Discussed risks and alternatives; removal preferred. - Perform toenail removal under local anesthesia. - At this time, we mutually agreed to proceed with total nail removal without chemical matricectomy to the left hallux toenail. Risks and complications were discussed with the patient for which they understand and  verbally consent to the procedure. Under sterile conditions a total of 3 mL of a mixture of 2% lidocaine plain and 0.5% Marcaine plain was infiltrated in a hallux block fashion. Once anesthetized, the skin was prepped in sterile fashion. A tourniquet was then applied. Next the left hallux nail was removed in total making sure remove all nail borders.  Once the nail was removed, the area was debrided and the underlying skin was intact. The area was irrigated and hemostasis was obtained.  A dry sterile dressing was applied. After application of the dressing the tourniquet was removed and there is found to be an immediate capillary refill time to the digit. The patient tolerated the procedure well any complications. Post procedure instructions were discussed the patient for which he verbally understood. Follow-up in one week for nail check or sooner if any problems are to arise. Discussed signs/symptoms of worsening  infection and directed to call the office immediately should any occur or go directly to the emergency room. In the meantime, encouraged to call the office with any questions, concerns, changes  symptoms. - Nail sent for culture  - Discuss potential use of urea nail gel or compounded antifungal treatments if the nail grows back thick or discolored. - Advise avoiding cycling for a few days to a week until comfortable.    Return in about 2 years (around 08/12/2025), or if symptoms worsen or fail to improve, for nail check .   Donnice JONELLE Fees DPM

## 2023-08-27 ENCOUNTER — Telehealth: Payer: Self-pay

## 2023-08-27 NOTE — Telephone Encounter (Signed)
 Received an email from [employer] @ [facility]. We mistakenly sent patient's toenail sample to [facility] but ordered on a [facility] form. It was decided to run the sample at [facility], rather than try to ship it to [facility]. [facility] form completed and faxed to [facility] along with the office note and demographics.Arlyss will reach out to me directly if he needs anything else. thanks

## 2023-08-28 ENCOUNTER — Ambulatory Visit (INDEPENDENT_AMBULATORY_CARE_PROVIDER_SITE_OTHER): Admitting: Podiatry

## 2023-08-28 ENCOUNTER — Encounter: Payer: Self-pay | Admitting: Podiatry

## 2023-08-28 DIAGNOSIS — B351 Tinea unguium: Secondary | ICD-10-CM | POA: Diagnosis not present

## 2023-08-28 DIAGNOSIS — L600 Ingrowing nail: Secondary | ICD-10-CM | POA: Diagnosis not present

## 2023-08-28 NOTE — Progress Notes (Signed)
 Subjective: Chief Complaint  Patient presents with   Routine Post Op    F/U Toenail removal left great toe. 0 pain . Noin diabetic. Pt. Healing well.   63 year old female presents the office today for follow-up evaluation undergoing left total nail removal.  She states it is healing well.  She is okay for the first couple of weeks.  She has not seen any drainage or pus.  She is happy to progress so far.  No other concerns.  Objective: AAO x3, NAD DP/PT pulses palpable bilaterally, CRT less than 3 seconds Status post left hallux nail removal.  Nailbed is dry and is scabbed over.  There is no pain.  There is no edema, area, drainage or pus or any signs of infection noted today. No pain with calf compression, swelling, warmth, erythema  Assessment: Status post toenail avulsion, healing well  Plan: -All treatment options discussed with the patient including all alternatives, risks, complications.  -Continue to wash with soap and water.  Keep a Band-Aid on it during the day.  No signs of infection but continue to monitor for any reoccurrence.  Awaiting nail culture results I will contact her once of the results back. -Patient encouraged to call the office with any questions, concerns, change in symptoms.   Donnice JONELLE Fees DPM

## 2023-09-05 ENCOUNTER — Ambulatory Visit: Payer: Self-pay | Admitting: Podiatry
